# Patient Record
Sex: Male | Born: 1955 | Hispanic: No | Smoking: Never smoker
Health system: Southern US, Community
[De-identification: ages and names within clinical notes are randomized; demographics above are authoritative.]

## PROBLEM LIST (undated history)

## (undated) DIAGNOSIS — E119 Type 2 diabetes mellitus without complications: Secondary | ICD-10-CM

## (undated) DIAGNOSIS — M549 Dorsalgia, unspecified: Secondary | ICD-10-CM

## (undated) DIAGNOSIS — J45909 Unspecified asthma, uncomplicated: Secondary | ICD-10-CM

## (undated) DIAGNOSIS — I10 Essential (primary) hypertension: Secondary | ICD-10-CM

## (undated) HISTORY — PX: KNEE ARTHROSCOPY: SHX127

---

## 2016-05-05 ENCOUNTER — Ambulatory Visit: Payer: Self-pay | Admitting: Physician Assistant

## 2016-05-23 ENCOUNTER — Emergency Department: Payer: No Typology Code available for payment source

## 2016-05-23 ENCOUNTER — Emergency Department
Admission: EM | Admit: 2016-05-23 | Discharge: 2016-05-23 | Disposition: A | Discharge status: Home / Self Care | Payer: No Typology Code available for payment source | Attending: Emergency Medicine | Admitting: Emergency Medicine

## 2016-05-23 ENCOUNTER — Encounter: Payer: Self-pay | Admitting: Emergency Medicine

## 2016-05-23 DIAGNOSIS — S0093XA Contusion of unspecified part of head, initial encounter: Secondary | ICD-10-CM

## 2016-05-23 DIAGNOSIS — Z87891 Personal history of nicotine dependence: Secondary | ICD-10-CM | POA: Diagnosis not present

## 2016-05-23 DIAGNOSIS — Y92410 Unspecified street and highway as the place of occurrence of the external cause: Secondary | ICD-10-CM | POA: Insufficient documentation

## 2016-05-23 DIAGNOSIS — Z7984 Long term (current) use of oral hypoglycemic drugs: Secondary | ICD-10-CM | POA: Insufficient documentation

## 2016-05-23 DIAGNOSIS — Y999 Unspecified external cause status: Secondary | ICD-10-CM | POA: Diagnosis not present

## 2016-05-23 DIAGNOSIS — S0990XA Unspecified injury of head, initial encounter: Secondary | ICD-10-CM | POA: Diagnosis present

## 2016-05-23 DIAGNOSIS — M7918 Myalgia, other site: Secondary | ICD-10-CM

## 2016-05-23 DIAGNOSIS — Z79899 Other long term (current) drug therapy: Secondary | ICD-10-CM | POA: Insufficient documentation

## 2016-05-23 DIAGNOSIS — I10 Essential (primary) hypertension: Secondary | ICD-10-CM | POA: Diagnosis not present

## 2016-05-23 DIAGNOSIS — Y9389 Activity, other specified: Secondary | ICD-10-CM | POA: Diagnosis not present

## 2016-05-23 DIAGNOSIS — S0083XA Contusion of other part of head, initial encounter: Secondary | ICD-10-CM | POA: Insufficient documentation

## 2016-05-23 DIAGNOSIS — E119 Type 2 diabetes mellitus without complications: Secondary | ICD-10-CM | POA: Diagnosis not present

## 2016-05-23 DIAGNOSIS — M545 Low back pain: Secondary | ICD-10-CM | POA: Diagnosis not present

## 2016-05-23 HISTORY — DX: Essential (primary) hypertension: I10

## 2016-05-23 HISTORY — DX: Type 2 diabetes mellitus without complications: E11.9

## 2016-05-23 HISTORY — DX: Dorsalgia, unspecified: M54.9

## 2016-05-23 MED ORDER — IBUPROFEN 800 MG PO TABS: 800.0000 mg | ORAL_TABLET | Freq: Three times a day (TID) | ORAL | 0 refills | Status: DC | PRN
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
## 2016-05-23 NOTE — ED Notes (Signed)
PA aware of pt's BP, states okay for D/C.

## 2016-05-23 NOTE — ED Notes (Signed)
NAD noted at time of D/C. Pt denies questions or concerns. Pt ambulatory to the lobby at this time.  
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
## 2016-05-23 NOTE — ED Notes (Signed)
Pt given sprite 

## 2016-05-25 NOTE — Pre-Procedure Instructions (Addendum)
Jacob Bond  05/25/2016      [facility] - [place], [place] - [address] [place] [address] Phone: [phone] Fax: [phone]    Your procedure is scheduled on Wed. Aug. 9  Report to [facility] Admitting at 6:30 A.M.  Call this number if you have problems the morning of surgery:  [phone]              Any questions prior to surgery call [phone] Monday-Friday 8am-4pm   Remember:  Do not eat food or drink liquids after midnight on Tues. Aug. 8   Take these medicines the morning of surgery with A SIP OF WATER : percocet              Stop herbal medicines,all vitamins and NSAIDS: advil, ibuprofen, motrin,BC Powders, Goody's on Aug. 2             No metformin,glipizide am of surgery   How to Manage Your Diabetes Before and After Surgery  Why is it important to control my blood sugar before and after surgery? . Improving blood sugar levels before and after surgery helps healing and can limit problems. . A way of improving blood sugar control is eating a healthy diet by: o  Eating less sugar and carbohydrates o  Increasing activity/exercise o  Talking with your doctor about reaching your blood sugar goals . High blood sugars (greater than 180 mg/dL) can raise your risk of infections and slow your recovery, so you will need to focus on controlling your diabetes during the weeks before surgery. . Make sure that the doctor who takes care of your diabetes knows about your planned surgery including the date and location.  How do I manage my blood sugar before surgery? . Check your blood sugar at least 4 times a day, starting 2 days before surgery, to make sure that the level is not too high or low. o Check your blood sugar the morning of your surgery when you wake up and every 2 hours until you get to the Short Stay unit. . If your blood sugar is less than 70 mg/dL, you will need to treat for low blood sugar: o Do not take  insulin. o Treat a low blood sugar (less than 70 mg/dL) with  cup of clear juice (cranberry or apple), 4 glucose tablets, OR glucose gel. o Recheck blood sugar in 15 minutes after treatment (to make sure it is greater than 70 mg/dL). If your blood sugar is not greater than 70 mg/dL on recheck, call [phone] for further instructions. . Report your blood sugar to the short stay nurse when you get to Short Stay.  . If you are admitted to the hospital after surgery: o Your blood sugar will be checked by the staff and you will probably be given insulin after surgery (instead of oral diabetes medicines) to make sure you have good blood sugar levels. o The goal for blood sugar control after surgery is 80-180 mg/dL.       WHAT DO I DO ABOUT MY DIABETES MEDICATION?   Marland Kitchen Do not take oral diabetes medicines (pills) the morning of surgery.(metformin,glipizide)     Do not wear jewelry, make-up or nail polish.  Do not wear lotions, powders, or perfumes.  You may not wear deoderant.  Do not shave 48 hours prior to surgery.  Men may shave face and neck.  Do not bring valuables to the hospital.  [facility]  is not responsible for any belongings or valuables.  Contacts, dentures or bridgework may not be worn into surgery.  Leave your suitcase in the car.  After surgery it may be brought to your room.  For patients admitted to the hospital, discharge time will be determined by your treatment team.  Patients discharged the day of surgery will not be allowed to drive home.    Special instructions:   Special Instructions: [place] - Preparing for Surgery  Before surgery, you can play an important role.  Because skin is not sterile, your skin needs to be as free of germs as possible.  You can reduce the number of germs on you skin by washing with CHG (chlorahexidine gluconate) soap before surgery.  CHG is an antiseptic cleaner which kills germs and bonds with the skin to continue killing germs  even after washing.  Please DO NOT use if you have an allergy to CHG or antibacterial soaps.  If your skin becomes reddened/irritated stop using the CHG and inform your nurse when you arrive at Short Stay.  Do not shave (including legs and underarms) for at least 48 hours prior to the first CHG shower.  You may shave your face.  Please follow these instructions carefully:   1.  Shower with CHG Soap the night before surgery and the morning of Surgery.  2.  If you choose to wash your hair, wash your hair first as usual with your normal shampoo.  3.  After you shampoo, rinse your hair and body thoroughly to remove the Shampoo.  4.  Use CHG as you would any other liquid soap.  You can apply chg directly  to the skin and wash gently with scrungie or a clean washcloth.  5.  Apply the CHG Soap to your body ONLY FROM THE NECK DOWN.  Do not use on open wounds or open sores.  Avoid contact with your eyes ears, mouth and genitals (private parts).  Wash genitals (private parts)       with your normal soap.  6.  Wash thoroughly, paying special attention to the area where your surgery will be performed.  7.  Thoroughly rinse your body with warm water from the neck down.  8.  DO NOT shower/wash with your normal soap after using and rinsing off the CHG Soap.  9.  Pat yourself dry with a clean towel.            10.  Wear clean pajamas.            11.  Place clean sheets on your bed the night of your first shower and do not sleep with pets.  Day of Surgery  Do not apply any lotions/deodorants the morning of surgery.  Please wear clean clothes to the hospital/surgery center.  Please read over the following fact sheets that you were given. Pain Booklet, Coughing and Deep Breathing, MRSA Information and Surgical Site Infection Prevention

## 2016-05-26 ENCOUNTER — Encounter (HOSPITAL_COMMUNITY)
Admission: RE | Admit: 2016-05-26 | Discharge: 2016-05-26 | Disposition: A | Discharge status: Home / Self Care | Payer: Worker's Compensation | Source: Ambulatory Visit | Attending: Orthopedic Surgery | Admitting: Orthopedic Surgery

## 2016-05-26 ENCOUNTER — Encounter (HOSPITAL_COMMUNITY): Payer: Self-pay

## 2016-05-26 DIAGNOSIS — M5116 Intervertebral disc disorders with radiculopathy, lumbar region: Secondary | ICD-10-CM | POA: Diagnosis not present

## 2016-05-26 DIAGNOSIS — M4316 Spondylolisthesis, lumbar region: Secondary | ICD-10-CM | POA: Diagnosis not present

## 2016-05-26 DIAGNOSIS — Z0183 Encounter for blood typing: Secondary | ICD-10-CM | POA: Diagnosis not present

## 2016-05-26 DIAGNOSIS — Z01818 Encounter for other preprocedural examination: Secondary | ICD-10-CM | POA: Insufficient documentation

## 2016-05-26 DIAGNOSIS — Z01812 Encounter for preprocedural laboratory examination: Secondary | ICD-10-CM | POA: Insufficient documentation

## 2016-05-26 HISTORY — DX: Unspecified asthma, uncomplicated: J45.909

## 2016-05-26 LAB — SURGICAL PCR SCREEN
MRSA, PCR: NEGATIVE
Staphylococcus aureus: POSITIVE — AB

## 2016-05-26 LAB — CBC
HCT: 40.9 % (ref 39.0–52.0)
HEMOGLOBIN: 13.6 g/dL (ref 13.0–17.0)
MCH: 32.4 pg (ref 26.0–34.0)
MCHC: 33.3 g/dL (ref 30.0–36.0)
MCV: 97.4 fL (ref 78.0–100.0)
PLATELETS: 291 10*3/uL (ref 150–400)
RBC: 4.2 MIL/uL — AB (ref 4.22–5.81)
RDW: 12.3 % (ref 11.5–15.5)
WBC: 10.2 10*3/uL (ref 4.0–10.5)

## 2016-05-26 LAB — BASIC METABOLIC PANEL
ANION GAP: 6 (ref 5–15)
BUN: 13 mg/dL (ref 6–20)
CHLORIDE: 105 mmol/L (ref 101–111)
CO2: 28 mmol/L (ref 22–32)
Calcium: 9.4 mg/dL (ref 8.9–10.3)
Creatinine, Ser: 1.13 mg/dL (ref 0.61–1.24)
Glucose, Bld: 191 mg/dL — ABNORMAL HIGH (ref 65–99)
POTASSIUM: 3.8 mmol/L (ref 3.5–5.1)
SODIUM: 139 mmol/L (ref 135–145)

## 2016-05-26 LAB — TYPE AND SCREEN
ABO/RH(D): A NEG
ANTIBODY SCREEN: NEGATIVE

## 2016-05-26 LAB — ABO/RH: ABO/RH(D): A NEG

## 2016-05-26 LAB — GLUCOSE, CAPILLARY: GLUCOSE-CAPILLARY: 190 mg/dL — AB (ref 65–99)

## 2016-05-27 LAB — HEMOGLOBIN A1C
HEMOGLOBIN A1C: 8.6 % — AB (ref 4.8–5.6)
Mean Plasma Glucose: 200 mg/dL

## 2016-06-02 MED ORDER — CEFAZOLIN SODIUM-DEXTROSE 2-4 GM/100ML-% IV SOLN
2.0000 g | INTRAVENOUS | Status: AC
  Administered 2016-06-03: 2 g via INTRAVENOUS
  Filled 2016-06-02: qty 100

## 2016-06-02 NOTE — Anesthesia Preprocedure Evaluation (Addendum)
Anesthesia Evaluation  Patient identified by MRN, date of birth, ID band Patient awake    Reviewed: Allergy & Precautions, H&P , NPO status , Patient's Chart, lab work & pertinent test results  Airway Mallampati: II  TM Distance: >3 FB Neck ROM: Full    Dental no notable dental hx. (+) Teeth Intact, Dental Advisory Given   Pulmonary neg pulmonary ROS,    Pulmonary exam normal breath sounds clear to auscultation       Cardiovascular hypertension, Pt. on medications  Rhythm:Regular Rate:Normal     Neuro/Psych negative neurological ROS  negative psych ROS   GI/Hepatic negative GI ROS, Neg liver ROS,   Endo/Other  diabetes, Type 2, Oral Hypoglycemic Agents  Renal/GU negative Renal ROS  negative genitourinary   Musculoskeletal   Abdominal   Peds  Hematology negative hematology ROS (+)   Anesthesia Other Findings   Reproductive/Obstetrics negative OB ROS                            Anesthesia Physical Anesthesia Plan  ASA: II  Anesthesia Plan: General   Post-op Pain Management:    Induction: Intravenous  Airway Management Planned: Oral ETT  Additional Equipment:   Intra-op Plan:   Post-operative Plan: Extubation in OR  Informed Consent: I have reviewed the patients History and Physical, chart, labs and discussed the procedure including the risks, benefits and alternatives for the proposed anesthesia with the patient or authorized representative who has indicated his/her understanding and acceptance.   Dental advisory given  Plan Discussed with: CRNA  Anesthesia Plan Comments:         Anesthesia Quick Evaluation  

## 2016-06-03 ENCOUNTER — Inpatient Hospital Stay (HOSPITAL_COMMUNITY): Payer: Worker's Compensation

## 2016-06-03 ENCOUNTER — Inpatient Hospital Stay (HOSPITAL_COMMUNITY)
Admission: RE | Admit: 2016-06-03 | Discharge: 2016-06-05 | DRG: 460 | Disposition: A | Discharge status: Rehabilitation Facility | Payer: Worker's Compensation | Source: Ambulatory Visit | Attending: Orthopedic Surgery | Admitting: Orthopedic Surgery

## 2016-06-03 ENCOUNTER — Encounter (HOSPITAL_COMMUNITY): Payer: Self-pay | Admitting: Certified Registered Nurse Anesthetist

## 2016-06-03 ENCOUNTER — Inpatient Hospital Stay (HOSPITAL_COMMUNITY): Payer: Worker's Compensation | Admitting: Anesthesiology

## 2016-06-03 ENCOUNTER — Encounter (HOSPITAL_COMMUNITY)
Admission: RE | Disposition: A | Discharge status: Rehabilitation Facility | Payer: Self-pay | Source: Ambulatory Visit | Attending: Orthopedic Surgery

## 2016-06-03 DIAGNOSIS — Z79899 Other long term (current) drug therapy: Secondary | ICD-10-CM

## 2016-06-03 DIAGNOSIS — Z794 Long term (current) use of insulin: Secondary | ICD-10-CM | POA: Diagnosis not present

## 2016-06-03 DIAGNOSIS — E109 Type 1 diabetes mellitus without complications: Secondary | ICD-10-CM | POA: Diagnosis present

## 2016-06-03 DIAGNOSIS — Z981 Arthrodesis status: Secondary | ICD-10-CM | POA: Diagnosis not present

## 2016-06-03 DIAGNOSIS — I10 Essential (primary) hypertension: Secondary | ICD-10-CM

## 2016-06-03 DIAGNOSIS — Z96651 Presence of right artificial knee joint: Secondary | ICD-10-CM | POA: Diagnosis present

## 2016-06-03 DIAGNOSIS — M5116 Intervertebral disc disorders with radiculopathy, lumbar region: Secondary | ICD-10-CM | POA: Diagnosis present

## 2016-06-03 DIAGNOSIS — M4316 Spondylolisthesis, lumbar region: Principal | ICD-10-CM | POA: Diagnosis present

## 2016-06-03 DIAGNOSIS — M4806 Spinal stenosis, lumbar region: Secondary | ICD-10-CM | POA: Diagnosis present

## 2016-06-03 DIAGNOSIS — M5489 Other dorsalgia: Secondary | ICD-10-CM | POA: Diagnosis present

## 2016-06-03 DIAGNOSIS — E7800 Pure hypercholesterolemia, unspecified: Secondary | ICD-10-CM | POA: Diagnosis present

## 2016-06-03 DIAGNOSIS — E119 Type 2 diabetes mellitus without complications: Secondary | ICD-10-CM

## 2016-06-03 DIAGNOSIS — Z419 Encounter for procedure for purposes other than remedying health state, unspecified: Secondary | ICD-10-CM

## 2016-06-03 DIAGNOSIS — M5441 Lumbago with sciatica, right side: Secondary | ICD-10-CM | POA: Diagnosis not present

## 2016-06-03 DIAGNOSIS — G8929 Other chronic pain: Secondary | ICD-10-CM | POA: Diagnosis present

## 2016-06-03 DIAGNOSIS — M549 Dorsalgia, unspecified: Secondary | ICD-10-CM

## 2016-06-03 HISTORY — PX: TRANSFORAMINAL LUMBAR INTERBODY FUSION (TLIF) WITH PEDICLE SCREW FIXATION 1 LEVEL: SHX6141

## 2016-06-03 LAB — GLUCOSE, CAPILLARY
GLUCOSE-CAPILLARY: 164 mg/dL — AB (ref 65–99)
GLUCOSE-CAPILLARY: 157 mg/dL — AB (ref 65–99)
GLUCOSE-CAPILLARY: 165 mg/dL — AB (ref 65–99)
GLUCOSE-CAPILLARY: 167 mg/dL — AB (ref 65–99)
Glucose-Capillary: 78 mg/dL (ref 65–99)

## 2016-06-03 SURGERY — TRANSFORAMINAL LUMBAR INTERBODY FUSION (TLIF) WITH PEDICLE SCREW FIXATION 1 LEVEL
Anesthesia: General

## 2016-06-03 MED ORDER — OXYCODONE-ACETAMINOPHEN 10-325 MG PO TABS: 1.0000 | ORAL_TABLET | ORAL | 0 refills | Status: DC | PRN

## 2016-06-03 MED ORDER — PROPOFOL 10 MG/ML IV BOLUS
INTRAVENOUS | Status: AC
  Filled 2016-06-03: qty 20

## 2016-06-03 MED ORDER — LOSARTAN POTASSIUM-HCTZ 100-25 MG PO TABS: 1.0000 | ORAL_TABLET | Freq: Every day | ORAL | Status: DC

## 2016-06-03 MED ORDER — LACTATED RINGERS IV SOLN
INTRAVENOUS | Status: DC | PRN
  Administered 2016-06-03 (×2): via INTRAVENOUS

## 2016-06-03 MED ORDER — LOSARTAN POTASSIUM 50 MG PO TABS
100.0000 mg | ORAL_TABLET | Freq: Every day | ORAL | Status: DC
  Administered 2016-06-03 – 2016-06-05 (×3): 100 mg via ORAL
  Filled 2016-06-03 (×3): qty 2

## 2016-06-03 MED ORDER — GLYCOPYRROLATE 0.2 MG/ML IJ SOLN
INTRAMUSCULAR | Status: DC | PRN
  Administered 2016-06-03: 0.2 mg via INTRAVENOUS

## 2016-06-03 MED ORDER — PRAVASTATIN SODIUM 40 MG PO TABS
20.0000 mg | ORAL_TABLET | Freq: Every day | ORAL | Status: DC
  Administered 2016-06-03 – 2016-06-05 (×3): 20 mg via ORAL
  Filled 2016-06-03 (×3): qty 1

## 2016-06-03 MED ORDER — HYDROMORPHONE HCL 1 MG/ML IJ SOLN
INTRAMUSCULAR | Status: AC
  Administered 2016-06-03: 0.5 mg via INTRAVENOUS
  Filled 2016-06-03: qty 1

## 2016-06-03 MED ORDER — INSULIN ASPART 100 UNIT/ML ~~LOC~~ SOLN
0.0000 [IU] | Freq: Three times a day (TID) | SUBCUTANEOUS | Status: DC
  Administered 2016-06-04: 3 [IU] via SUBCUTANEOUS
  Administered 2016-06-04 (×2): 2 [IU] via SUBCUTANEOUS
  Administered 2016-06-05: 3 [IU] via SUBCUTANEOUS
  Administered 2016-06-05: 2 [IU] via SUBCUTANEOUS

## 2016-06-03 MED ORDER — PHENYLEPHRINE HCL 10 MG/ML IJ SOLN
INTRAVENOUS | Status: DC | PRN
  Administered 2016-06-03: 50 ug/min via INTRAVENOUS
  Administered 2016-06-03: 12:00:00 via INTRAVENOUS

## 2016-06-03 MED ORDER — DEXAMETHASONE SODIUM PHOSPHATE 10 MG/ML IJ SOLN
INTRAMUSCULAR | Status: AC
  Filled 2016-06-03: qty 1

## 2016-06-03 MED ORDER — CEFAZOLIN IN D5W 1 GM/50ML IV SOLN
1.0000 g | Freq: Three times a day (TID) | INTRAVENOUS | Status: AC
  Administered 2016-06-03 (×2): 1 g via INTRAVENOUS
  Filled 2016-06-03 (×2): qty 50

## 2016-06-03 MED ORDER — BUPIVACAINE-EPINEPHRINE (PF) 0.25% -1:200000 IJ SOLN
INTRAMUSCULAR | Status: AC
  Filled 2016-06-03: qty 30

## 2016-06-03 MED ORDER — LACTATED RINGERS IV SOLN: INTRAVENOUS | Status: DC

## 2016-06-03 MED ORDER — PHENYLEPHRINE 40 MCG/ML (10ML) SYRINGE FOR IV PUSH (FOR BLOOD PRESSURE SUPPORT)
PREFILLED_SYRINGE | INTRAVENOUS | Status: AC
  Filled 2016-06-03: qty 10

## 2016-06-03 MED ORDER — MORPHINE SULFATE (PF) 2 MG/ML IV SOLN
1.0000 mg | INTRAVENOUS | Status: DC | PRN
  Administered 2016-06-03: 4 mg via INTRAVENOUS
  Filled 2016-06-03: qty 2

## 2016-06-03 MED ORDER — PHENOL 1.4 % MT LIQD: 1.0000 | OROMUCOSAL | Status: DC | PRN

## 2016-06-03 MED ORDER — GLIPIZIDE ER 2.5 MG PO TB24
2.5000 mg | ORAL_TABLET | Freq: Every day | ORAL | Status: DC
  Administered 2016-06-04 – 2016-06-05 (×2): 2.5 mg via ORAL
  Filled 2016-06-03 (×2): qty 1

## 2016-06-03 MED ORDER — METOPROLOL TARTARATE 1 MG/ML SYRINGE (5ML)
Status: DC | PRN
  Administered 2016-06-03: 2.5 mg via INTRAVENOUS

## 2016-06-03 MED ORDER — SODIUM CHLORIDE 0.9 % IV SOLN: 250.0000 mL | INTRAVENOUS | Status: DC

## 2016-06-03 MED ORDER — BUPIVACAINE-EPINEPHRINE 0.25% -1:200000 IJ SOLN
INTRAMUSCULAR | Status: DC | PRN
  Administered 2016-06-03: 20 mL

## 2016-06-03 MED ORDER — METFORMIN HCL 500 MG PO TABS
1000.0000 mg | ORAL_TABLET | Freq: Two times a day (BID) | ORAL | Status: DC
  Administered 2016-06-03 – 2016-06-05 (×4): 1000 mg via ORAL
  Filled 2016-06-03 (×4): qty 2

## 2016-06-03 MED ORDER — MIDAZOLAM HCL 2 MG/2ML IJ SOLN
INTRAMUSCULAR | Status: AC
  Filled 2016-06-03: qty 2

## 2016-06-03 MED ORDER — ARTIFICIAL TEARS OP OINT
TOPICAL_OINTMENT | OPHTHALMIC | Status: AC
  Filled 2016-06-03: qty 3.5

## 2016-06-03 MED ORDER — PROPOFOL 10 MG/ML IV BOLUS
INTRAVENOUS | Status: DC | PRN
  Administered 2016-06-03: 200 mg via INTRAVENOUS
  Administered 2016-06-03: 50 mg via INTRAVENOUS

## 2016-06-03 MED ORDER — HEMOSTATIC AGENTS (NO CHARGE) OPTIME
TOPICAL | Status: DC | PRN
  Administered 2016-06-03: 1 via TOPICAL

## 2016-06-03 MED ORDER — PROPOFOL 1000 MG/100ML IV EMUL
INTRAVENOUS | Status: AC
  Filled 2016-06-03: qty 200

## 2016-06-03 MED ORDER — DEXAMETHASONE SODIUM PHOSPHATE 10 MG/ML IJ SOLN
INTRAMUSCULAR | Status: DC | PRN
  Administered 2016-06-03: 5 mg via INTRAVENOUS

## 2016-06-03 MED ORDER — OXYCODONE HCL 5 MG PO TABS
ORAL_TABLET | ORAL | Status: AC
  Filled 2016-06-03: qty 2

## 2016-06-03 MED ORDER — SUCCINYLCHOLINE CHLORIDE 200 MG/10ML IV SOSY
PREFILLED_SYRINGE | INTRAVENOUS | Status: AC
  Filled 2016-06-03: qty 10

## 2016-06-03 MED ORDER — PROPOFOL 500 MG/50ML IV EMUL
INTRAVENOUS | Status: DC | PRN
Start: 2016-06-03 — End: 2016-06-03
  Administered 2016-06-03: 11:00:00 via INTRAVENOUS
  Administered 2016-06-03: 25 ug/kg/min via INTRAVENOUS

## 2016-06-03 MED ORDER — METHOCARBAMOL 500 MG PO TABS
500.0000 mg | ORAL_TABLET | Freq: Four times a day (QID) | ORAL | Status: DC | PRN
Start: 2016-06-03 — End: 2016-06-05
  Administered 2016-06-03 – 2016-06-05 (×7): 500 mg via ORAL
  Filled 2016-06-03 (×8): qty 1

## 2016-06-03 MED ORDER — DEXTROSE 5 % IV SOLN
500.0000 mg | Freq: Four times a day (QID) | INTRAVENOUS | Status: DC | PRN
  Filled 2016-06-03: qty 5

## 2016-06-03 MED ORDER — GLYCOPYRROLATE 0.2 MG/ML IV SOSY
PREFILLED_SYRINGE | INTRAVENOUS | Status: AC
  Filled 2016-06-03: qty 3

## 2016-06-03 MED ORDER — ONDANSETRON HCL 4 MG/2ML IJ SOLN: 4.0000 mg | INTRAMUSCULAR | Status: DC | PRN

## 2016-06-03 MED ORDER — ROCURONIUM BROMIDE 10 MG/ML (PF) SYRINGE
PREFILLED_SYRINGE | INTRAVENOUS | Status: AC
  Filled 2016-06-03: qty 10

## 2016-06-03 MED ORDER — METHOCARBAMOL 500 MG PO TABS: 500.0000 mg | ORAL_TABLET | Freq: Three times a day (TID) | ORAL | 0 refills | Status: DC | PRN

## 2016-06-03 MED ORDER — ONDANSETRON HCL 4 MG/2ML IJ SOLN
INTRAMUSCULAR | Status: AC
  Filled 2016-06-03: qty 2

## 2016-06-03 MED ORDER — HYDROMORPHONE HCL 1 MG/ML IJ SOLN
0.2500 mg | INTRAMUSCULAR | Status: DC | PRN
  Administered 2016-06-03 (×4): 0.5 mg via INTRAVENOUS

## 2016-06-03 MED ORDER — ARTIFICIAL TEARS OP OINT
TOPICAL_OINTMENT | OPHTHALMIC | Status: DC | PRN
  Administered 2016-06-03: 1 via OPHTHALMIC

## 2016-06-03 MED ORDER — METOPROLOL TARTRATE 5 MG/5ML IV SOLN
INTRAVENOUS | Status: AC
  Filled 2016-06-03: qty 5

## 2016-06-03 MED ORDER — SODIUM CHLORIDE 0.9% FLUSH
3.0000 mL | Freq: Two times a day (BID) | INTRAVENOUS | Status: DC
  Administered 2016-06-03 (×2): 3 mL via INTRAVENOUS

## 2016-06-03 MED ORDER — CEFAZOLIN SODIUM 1 G IJ SOLR
INTRAMUSCULAR | Status: AC
  Filled 2016-06-03: qty 20

## 2016-06-03 MED ORDER — ACETAMINOPHEN 10 MG/ML IV SOLN
INTRAVENOUS | Status: AC
  Filled 2016-06-03: qty 100

## 2016-06-03 MED ORDER — LIDOCAINE HCL (CARDIAC) 20 MG/ML IV SOLN
INTRAVENOUS | Status: DC | PRN
  Administered 2016-06-03: 100 mg via INTRATRACHEAL

## 2016-06-03 MED ORDER — FENTANYL CITRATE (PF) 250 MCG/5ML IJ SOLN
INTRAMUSCULAR | Status: DC | PRN
  Administered 2016-06-03: 150 ug via INTRAVENOUS
  Administered 2016-06-03: 100 ug via INTRAVENOUS

## 2016-06-03 MED ORDER — THROMBIN 20000 UNITS EX SOLR
CUTANEOUS | Status: AC
  Filled 2016-06-03: qty 20000

## 2016-06-03 MED ORDER — 0.9 % SODIUM CHLORIDE (POUR BTL) OPTIME
TOPICAL | Status: DC | PRN
  Administered 2016-06-03 (×2): 1000 mL

## 2016-06-03 MED ORDER — INSULIN ASPART 100 UNIT/ML ~~LOC~~ SOLN
0.0000 [IU] | SUBCUTANEOUS | Status: DC
  Administered 2016-06-03: 3 [IU] via SUBCUTANEOUS

## 2016-06-03 MED ORDER — ACETAMINOPHEN 10 MG/ML IV SOLN
INTRAVENOUS | Status: DC | PRN
  Administered 2016-06-03: 1000 mg via INTRAVENOUS

## 2016-06-03 MED ORDER — SUCCINYLCHOLINE CHLORIDE 20 MG/ML IJ SOLN
INTRAMUSCULAR | Status: DC | PRN
  Administered 2016-06-03: 120 mg via INTRAVENOUS

## 2016-06-03 MED ORDER — FENTANYL CITRATE (PF) 250 MCG/5ML IJ SOLN
INTRAMUSCULAR | Status: AC
  Filled 2016-06-03: qty 5

## 2016-06-03 MED ORDER — OXYCODONE HCL 5 MG PO TABS
10.0000 mg | ORAL_TABLET | ORAL | Status: DC | PRN
  Administered 2016-06-03 – 2016-06-05 (×11): 10 mg via ORAL
  Filled 2016-06-03 (×10): qty 2

## 2016-06-03 MED ORDER — ONDANSETRON HCL 4 MG PO TABS: 4.0000 mg | ORAL_TABLET | Freq: Three times a day (TID) | ORAL | 0 refills | Status: DC | PRN

## 2016-06-03 MED ORDER — PHENYLEPHRINE HCL 10 MG/ML IJ SOLN
INTRAMUSCULAR | Status: DC | PRN
  Administered 2016-06-03 (×2): 120 ug via INTRAVENOUS
  Administered 2016-06-03: 160 ug via INTRAVENOUS

## 2016-06-03 MED ORDER — HYDROCHLOROTHIAZIDE 25 MG PO TABS
25.0000 mg | ORAL_TABLET | Freq: Every day | ORAL | Status: DC
  Administered 2016-06-03 – 2016-06-05 (×3): 25 mg via ORAL
  Filled 2016-06-03 (×3): qty 1

## 2016-06-03 MED ORDER — ONDANSETRON HCL 4 MG/2ML IJ SOLN
INTRAMUSCULAR | Status: DC | PRN
  Administered 2016-06-03: 4 mg via INTRAVENOUS

## 2016-06-03 MED ORDER — HYDROMORPHONE HCL 1 MG/ML IJ SOLN
0.5000 mg | INTRAMUSCULAR | Status: AC | PRN
  Administered 2016-06-03 (×2): 0.5 mg via INTRAVENOUS

## 2016-06-03 MED ORDER — MENTHOL 3 MG MT LOZG: 1.0000 | LOZENGE | OROMUCOSAL | Status: DC | PRN

## 2016-06-03 MED ORDER — LIDOCAINE 2% (20 MG/ML) 5 ML SYRINGE
INTRAMUSCULAR | Status: AC
  Filled 2016-06-03: qty 5

## 2016-06-03 MED ORDER — SODIUM CHLORIDE 0.9% FLUSH: 3.0000 mL | INTRAVENOUS | Status: DC | PRN

## 2016-06-03 MED ORDER — LUBIPROSTONE 24 MCG PO CAPS
24.0000 ug | ORAL_CAPSULE | Freq: Two times a day (BID) | ORAL | Status: DC
  Administered 2016-06-03 – 2016-06-05 (×4): 24 ug via ORAL
  Filled 2016-06-03 (×5): qty 1

## 2016-06-03 MED ORDER — DEXTROSE 5 % IV SOLN
500.0000 mg | Freq: Once | INTRAVENOUS | Status: AC
Start: 2016-06-03 — End: 2016-06-03
  Administered 2016-06-03: 500 mg via INTRAVENOUS
  Filled 2016-06-03: qty 5

## 2016-06-03 MED ORDER — THROMBIN 20000 UNITS EX SOLR
CUTANEOUS | Status: DC | PRN
  Administered 2016-06-03: 20000 [IU] via TOPICAL

## 2016-06-03 MED ORDER — MIDAZOLAM HCL 2 MG/2ML IJ SOLN
INTRAMUSCULAR | Status: DC | PRN
  Administered 2016-06-03 (×2): 1 mg via INTRAVENOUS

## 2016-06-03 SURGICAL SUPPLY — 72 items
BENZOIN TINCTURE PRP APPL 2/3 (GAUZE/BANDAGES/DRESSINGS) ×3 IMPLANT
BLADE SURG ROTATE 9660 (MISCELLANEOUS) IMPLANT
BUR EGG ELITE 4.0 (BURR) IMPLANT
BUR EGG ELITE 4.0MM (BURR)
CAGE TLIF NANOLOCK 11 (Cage) ×2 IMPLANT
CAGE TLIF NANOLOCK 11MM (Cage) ×1 IMPLANT
CANISTER SUCT 3000ML (MISCELLANEOUS) ×3 IMPLANT
CLIP NEUROVISION LG (CLIP) ×3 IMPLANT
CLOSURE STERI-STRIP 1/2X4 (GAUZE/BANDAGES/DRESSINGS) ×1
CLSR STERI-STRIP ANTIMIC 1/2X4 (GAUZE/BANDAGES/DRESSINGS) ×2 IMPLANT
COVER SURGICAL LIGHT HANDLE (MISCELLANEOUS) ×3 IMPLANT
DRAPE C-ARM 42X72 X-RAY (DRAPES) ×3 IMPLANT
DRAPE C-ARMOR (DRAPES) ×3 IMPLANT
DRAPE POUCH INSTRU U-SHP 10X18 (DRAPES) ×3 IMPLANT
DRAPE SURG 17X23 STRL (DRAPES) ×3 IMPLANT
DRAPE U-SHAPE 47X51 STRL (DRAPES) ×3 IMPLANT
DRSG AQUACEL AG ADV 3.5X10 (GAUZE/BANDAGES/DRESSINGS) ×3 IMPLANT
DURAPREP 26ML APPLICATOR (WOUND CARE) ×3 IMPLANT
ELECT BLADE 4.0 EZ CLEAN MEGAD (MISCELLANEOUS) ×3
ELECT BLADE 6.5 EXT (BLADE) IMPLANT
ELECT PENCIL ROCKER SW 15FT (MISCELLANEOUS) ×3 IMPLANT
ELECT REM PT RETURN 9FT ADLT (ELECTROSURGICAL) ×3
ELECTRODE BLDE 4.0 EZ CLN MEGD (MISCELLANEOUS) ×1 IMPLANT
ELECTRODE REM PT RTRN 9FT ADLT (ELECTROSURGICAL) ×1 IMPLANT
GLOVE BIOGEL PI IND STRL 8.5 (GLOVE) ×1 IMPLANT
GLOVE BIOGEL PI INDICATOR 8.5 (GLOVE) ×2
GLOVE SS BIOGEL STRL SZ 8.5 (GLOVE) ×1 IMPLANT
GLOVE SUPERSENSE BIOGEL SZ 8.5 (GLOVE) ×2
GOWN STRL REUS W/ TWL LRG LVL3 (GOWN DISPOSABLE) ×1 IMPLANT
GOWN STRL REUS W/TWL 2XL LVL3 (GOWN DISPOSABLE) ×6 IMPLANT
GOWN STRL REUS W/TWL LRG LVL3 (GOWN DISPOSABLE) ×2
GUIDEWIRE NITINOL BEVEL TIP (WIRE) ×12 IMPLANT
KIT BASIN OR (CUSTOM PROCEDURE TRAY) ×3 IMPLANT
KIT POSITION SURG JACKSON T1 (MISCELLANEOUS) IMPLANT
KIT ROOM TURNOVER OR (KITS) ×3 IMPLANT
MODULE NVM5 NEXT GEN EMG (NEEDLE) ×3 IMPLANT
NEEDLE 22X1 1/2 (OR ONLY) (NEEDLE) ×3 IMPLANT
NEEDLE I-PASS III (NEEDLE) ×3 IMPLANT
NEEDLE SPNL 18GX3.5 QUINCKE PK (NEEDLE) ×6 IMPLANT
NS IRRIG 1000ML POUR BTL (IV SOLUTION) ×6 IMPLANT
PACK LAMINECTOMY ORTHO (CUSTOM PROCEDURE TRAY) ×3 IMPLANT
PACK UNIVERSAL I (CUSTOM PROCEDURE TRAY) ×3 IMPLANT
PAD ARMBOARD 7.5X6 YLW CONV (MISCELLANEOUS) ×6 IMPLANT
PATTIES SURGICAL .5 X.5 (GAUZE/BANDAGES/DRESSINGS) ×3 IMPLANT
PATTIES SURGICAL .5 X1 (DISPOSABLE) ×6 IMPLANT
POSITIONER HEAD PRONE TRACH (MISCELLANEOUS) ×3 IMPLANT
PROBE BALL TIP NVM5 SNG USE (BALLOONS) ×3 IMPLANT
PUTTY DBX 1CC (Putty) ×3 IMPLANT
PUTTY DBX 1CC DEPUY (Putty) ×1 IMPLANT
REDUCTION EXT RELINE MAS MOD (Neuro Prosthesis/Implant) ×6 IMPLANT
ROD RELINE MAS LORD 5.5X45MM (Rod) ×6 IMPLANT
SCREW LOCK RELINE 5.5 TULIP (Screw) ×12 IMPLANT
SCREW RELINE RED 6.5X45MM POLY (Screw) ×3 IMPLANT
SCREW RELINE RED 6.5X50MM POLY (Screw) ×3 IMPLANT
SCREW SHANK MAS MOD 6.5X50MM (Screw) ×3 IMPLANT
SCREW SHANK RELINE 6.5X45MM 2C (Screw) ×3 IMPLANT
SHEET CONFORM 45LX20WX5H (Bone Implant) ×3 IMPLANT
SPONGE LAP 4X18 X RAY DECT (DISPOSABLE) ×6 IMPLANT
SPONGE SURGIFOAM ABS GEL 100 (HEMOSTASIS) ×3 IMPLANT
SURGIFLO W/THROMBIN 8M KIT (HEMOSTASIS) ×3 IMPLANT
SUT BONE WAX W31G (SUTURE) ×3 IMPLANT
SUT MNCRL AB 3-0 PS2 18 (SUTURE) ×6 IMPLANT
SUT VIC AB 1 CT1 18XCR BRD 8 (SUTURE) ×1 IMPLANT
SUT VIC AB 1 CT1 8-18 (SUTURE) ×2
SUT VIC AB 2-0 CT1 18 (SUTURE) ×3 IMPLANT
SYR BULB IRRIGATION 50ML (SYRINGE) ×3 IMPLANT
SYR CONTROL 10ML LL (SYRINGE) ×3 IMPLANT
TOWEL OR 17X24 6PK STRL BLUE (TOWEL DISPOSABLE) ×3 IMPLANT
TOWEL OR 17X26 10 PK STRL BLUE (TOWEL DISPOSABLE) ×3 IMPLANT
TRAY FOLEY CATH 16FRSI W/METER (SET/KITS/TRAYS/PACK) ×3 IMPLANT
WATER STERILE IRR 1000ML POUR (IV SOLUTION) IMPLANT
YANKAUER SUCT BULB TIP NO VENT (SUCTIONS) ×3 IMPLANT

## 2016-06-03 NOTE — Anesthesia Postprocedure Evaluation (Signed)
Anesthesia Post Note  Patient: [place]  Procedure(s) Performed: Procedure(s) (LRB): TRANSFORAMINAL LUMBAR INTERBODY FUSION (TLIF) WITH PEDICLE SCREW FIXATION 1 LEVEL (N/A)  Patient location during evaluation: PACU Anesthesia Type: General Level of consciousness: awake and alert Pain management: pain level controlled Vital Signs Assessment: post-procedure vital signs reviewed and stable Respiratory status: spontaneous breathing, nonlabored ventilation and respiratory function stable Cardiovascular status: blood pressure returned to baseline and stable Postop Assessment: no signs of nausea or vomiting Anesthetic complications: no    Last Vitals:  Vitals:   06/03/16 1300 06/03/16 1315  BP: 130/88 133/86  Pulse: 93 80  Resp: (!) 21 (!) 24  Temp:      Last Pain:  Vitals:   06/03/16 1238  TempSrc:   PainSc: 0-No pain                 Mel Langan,W. EDMOND

## 2016-06-03 NOTE — Progress Notes (Signed)
Rehab Admissions Coordinator Note:  Patient was screened by Trish MageLogue, Kania Regnier M for appropriateness for an Inpatient Acute Rehab Consult.  Noted PT/OT recommending CIR.  At this time, we are recommending Inpatient Rehab consult.  Trish MageLogue, Jeffery Gammell M 06/03/2016, 6:39 PM  I can be reached at [phone].

## 2016-06-03 NOTE — Brief Op Note (Signed)
06/03/2016  12:34 PM  PATIENT:  Jacob Bond  60 y.o. male  PRE-OPERATIVE DIAGNOSIS:  spondylolisthesis H&P L4-5 right radicular pain  POST-OPERATIVE DIAGNOSIS:  spondylolisthesis H&P L4-5 right radicular pain  PROCEDURE:  Procedure(s): TRANSFORAMINAL LUMBAR INTERBODY FUSION (TLIF) WITH PEDICLE SCREW FIXATION 1 LEVEL (N/A)  SURGEON:  Surgeon(s) and Role:    * Venita Lickahari Quaneisha Hanisch, MD - Primary  PHYSICIAN ASSISTANT:   ASSISTANTS: Carmen Mayo   ANESTHESIA:   general  EBL:  Total I/O In: 3000 [I.V.:3000] Out: 535 [Urine:210; Blood:325]  BLOOD ADMINISTERED:none  DRAINS: none   LOCAL MEDICATIONS USED:  MARCAINE     SPECIMEN:  No Specimen  DISPOSITION OF SPECIMEN:  N/A  COUNTS:  YES  TOURNIQUET:  * No tourniquets in log *  DICTATION: .Other Dictation: Dictation Number [id number]  PLAN OF CARE: Admit to inpatient   PATIENT DISPOSITION:  PACU - hemodynamically stable.
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
## 2016-06-03 NOTE — Transfer of Care (Signed)
Immediate Anesthesia Transfer of Care Note  Patient: [place]  Procedure(s) Performed: Procedure(s): TRANSFORAMINAL LUMBAR INTERBODY FUSION (TLIF) WITH PEDICLE SCREW FIXATION 1 LEVEL (N/A)  Patient Location: PACU  Anesthesia Type:General  Level of Consciousness: awake, alert , oriented and patient cooperative  Airway & Oxygen Therapy: Patient Spontanous Breathing and Patient connected to face mask oxygen  Post-op Assessment: Report given to RN, Post -op Vital signs reviewed and stable, Patient moving all extremities X 4 and Patient able to stick tongue midline  Post vital signs: Reviewed and stable  Last Vitals:  Vitals:   06/03/16 0637  BP: (!) 172/101  Pulse: 82  Resp: 20  Temp: 36.8 C    Last Pain:  Vitals:   06/03/16 0637  TempSrc: Oral  PainSc:       Patients Stated Pain Goal: 2 (06/03/16 [id number])  Complications: No apparent anesthesia complications

## 2016-06-03 NOTE — Op Note (Signed)
NAMGita Kudo:  Waldeck, Bonifacio               ACCOUNT NO.:  [id number]  MEDICAL RECORD NO.:  [id number]  LOCATION:  [facility]                         FACILITY:  [facility]  PHYSICIAN:  Abygail Galeno D. Shon BatonBrooks, M.D. DATE OF BIRTH:  1956-06-22  DATE OF PROCEDURE:  06/03/2016 DATE OF DISCHARGE:                              OPERATIVE REPORT   PREOPERATIVE DIAGNOSIS:  Degenerative lumbar spondylolisthesis, L4-5 with right radicular leg pain.  POSTOPERATIVE DIAGNOSIS:  Degenerative lumbar spondylolisthesis, L4-5 with right radicular leg pain.  OPERATIVE PROCEDURE:  Transforaminal lumbar interbody fusion, L4-5 with fusion.  IMPLANT SYSTEM USED:  NuVasive pedicle screw system.  At L4, we placed a 15-mm length 6.5 diameter screw.  At L5, we placed a 45-mm length 6.5 diameter screw with a Titan titanium nanoLOCK intervertebral TLIF cage, size 11 extra long.  SURGEON:  Laurisa Sahakian D. Shon BatonBrooks, M.D.  FIRST ASSISTANT:  Anette Riedelarmen Mayo, [place].  HISTORY:  This is a very pleasant 60 year old gentleman who 8 months ago was involved in a work related injury which resulted in significant back, buttock, and neuropathic right leg pain.  Despite injection therapy, physiotherapy, medications, activity modifications and other conservative management, he failed to improve and the symptoms progressed.  As a result of the failure of conservative care, we elected to proceed with surgery.  All appropriate risks, benefits, and alternatives were discussed with the patient and consent was obtained.  OPERATIVE NOTE:  The patient was brought to the operating room and placed supine on the operating table.  After successful induction of general anesthesia and endotracheal intubation, TEDs, SCDs, and Foley were inserted.  The neuromonitoring pads were applied and the patient was turned prone onto the Wilson frame.  All bony prominences were well padded.  The back was prepped and draped in a standard fashion.  Time- out was taken to confirm patient,  procedure, and all other pertinent important data.  Once this was done and since he was having mostly right radicular leg pain, I elected to do the decompression portion on the right side.  I began with placing the percutaneous left-sided 4-5 screws.  I identified the lateral border of the pedicle of L4 and L5, marked it and infiltrated with 0.25% Marcaine.  A small incision was made and a Jamshidi needle was advanced percutaneously down to the lateral aspect of the facet complex.  I then advanced the Jamshidi needle into the pedicle.  Using real-time EMG neuro monitoring as well as fluoro, I confirmed trajectory.  Once I was nearing the medial wall of the pedicle on the AP view, I switched to the lateral to confirm that I was just beyond the posterior wall of the vertebral body.  With no abnormal neural monitoring and EMG readings, I advanced the Jamshidi needle through the pedicle and into the vertebral body of L4.  A guidepin was placed and I repeated the exact procedure at L5.  Once both pedicles were cannulated, I then tapped over the guidepins and then placed a 50-mm screw at L4 and a 45-mm length screw at L5.  Both screws were then directly stimulated and there was no response at 40 milliamps of stimulation.  At this point, I then went to the  contralateral side and again identified the lateral border of the L4 and L5 facet.  I marked this incision site out, infiltrated with 0.25% Marcaine and then made a Wiltse incision approximately 2-1/2 fingerbreadths from the midline. Sharp dissection was carried out down to the deep fascia.  The deep fascia was sharply incised, and I bluntly dissected through the paraspinal muscle so I could palpate the facet complex.  I then placed the Jamshidi needle and using the same technique I had used on the contralateral side I placed the Jamshidi needle and advanced it through the pedicle into the vertebral body of L4.  A guidepin was placed and  I repeated this procedure at L5.  Once both pedicles were cannulated, I then tapped over the guidepin and then placed the same sized screws that I had used on the left.  These however were attached to the retracting arms.  I then constructed my retractor.  I then again directly stimulated the pedicle screws and there was no response in 40 milliamps of stimulation.  Also the x-rays were satisfactory in terms of trajectory and positioning.  I then mobilized the medial portion of the paraspinal muscle.  I then placed my medial retracting blade.  I could now visualize the spinous process lamina and facet complex at L4-5.  Using bipolar electrocautery, I obtained hemostasis and then I used my Bovie to remove the facet capsule.  I then used curettes to expose the entire pars of L4 and the leading edge of the L4 lamina.  Once I had the bony landmarks identified, I used the osteotome to remove the entire inferior L4 facet complex.  Once this was done, I then used a 3 and 4 mm Kerrison punch to resect the remaining portion of the L4 pars so that I could identify the L4 nerve root in the foramen.  I then created a plane underneath the L4 lamina with a curved curette and then used my 3-mm Kerrison punch to perform a generous laminotomy of L3.  I then gently dissected through the very thickened ligamentum flavum with a Penfield 4 and then used my Kerrison punches to remove the ligamentum flavum.  There were significant epidural veins noted and these were all coagulated with bipolar electrocautery.  Once I was able to create a plane between the dura and the ligamentum flavum, I completely resected this so I could expose the lateral aspect of the thecal sac.  I then identified the L5 nerve root and traced it in towards the foramen.  At this point, I removed all the medial osteophytes from the superior L5 facet complex so I could visualize the medial and superior margins of the L5 pedicle.  I then  removed the remaining bone to completely expose the L4-5 space.  I now could identify the exiting L4 nerve root, traversing L5 nerve root, thecal sac and disk space as well as the L4 and L5 pedicles on this right side.  With the decompression complete, I then placed neural patties to protect as well as retract the nerve roots and then used a 15- blade scalpel to perform an annulotomy.  Then, using pituitary rongeurs, curved Kerrison rongeurs and curettes, I removed all of the disk material at the L4-5 disk level.  I made sure I could get all the way to the contralateral side with my scrapers so I had an adequate decompression.  I then placed my [facility], and alternating with Epstein curette underneath the posterior aspect of the annulus  and resected the disk herniation that was seen on x-ray that was more central.  At this point, I had an adequate diskectomy.  I could see the bleeding bone edges of the endplates.  I irrigated the wound copiously with normal saline and then placed the Conform allograft sheet along the anterior anulus along with bone graft.  I then obtained a size 11 extra long nanoLOCK cage, packed this with bone graft and DBX.  I then malleted the graft into position.  Once I pushed it across the midline, it came to rest in a horizontal plane in the anterior third of the disk space.  I was pleased with this in the final position.  I irrigated again copiously with normal saline and then dropped the kyphosis that was built into the frame.  I then applied the polyaxial heads to the screws and then measured and placed a 45-mm length rod and placed my locking caps.  The caps were then torqued off and tightened according to manufactures standards.  I then went back to the left-hand side, measured and placed the same size rod and torqued it off.  Final x-rays were taken which demonstrated satisfactory hardware position.  I then irrigated the wounds again and made sure I  had hemostasis using bipolar electrocautery.  I then closed the fascia with a #1 Stratafix suture and then superficial with 2-0 Vicryl sutures and 3- 0 Monocryl for the skin.  Steri-Strips and a dry dressing were applied, and the patient was transferred to the PACU without incident.  At the end of the case, all needle and sponge counts were correct.  There were no adverse intraoperative events.     Dontavius Keim D. Shon Baton, M.D.     DDB/MEDQ  D:  06/03/2016  T:  06/03/2016  Job:  [id number]  cc:   Dr. Shon Baton' office

## 2016-06-03 NOTE — Evaluation (Signed)
Physical Therapy Evaluation Patient Details Name: Jacob Bond MRN: [id number] DOB: [birth year] Today's Date: 06/03/2016   History of Present Illness  60 y.o. male s/p TLIF L4-5. PMH significant for HTN and DM type II.  Clinical Impression  Pt admitted with/for lumbar fusion surgury.  Pt currently limited functionally due to the problems listed. ( See problems list.)   Pt will benefit from PT to maximize function and safety in order to get ready for next venue listed below.     Follow Up Recommendations CIR    Equipment Recommendations  Rolling walker with 5" wheels    Recommendations for Other Services Rehab consult     Precautions / Restrictions Precautions Precautions: Back;Fall Precaution Booklet Issued: Yes (comment) Precaution Comments: Educated pt on all back precautions and began reviewing handout Required Braces or Orthoses: Spinal Brace Spinal Brace: Lumbar corset;Applied in sitting position Restrictions Weight Bearing Restrictions: No      Mobility  Bed Mobility Overal bed mobility: Needs Assistance Bed Mobility: Rolling;Sidelying to Sit Rolling: Min assist Sidelying to sit: Min assist       General bed mobility comments: Discussed and demo'd safe transition sit to/from sidelying  Transfers Overall transfer level: Needs assistance Equipment used: Rolling walker (2 wheeled) Transfers: Sit to/from Stand Sit to Stand: Min assist         General transfer comment: Stability assist  Ambulation/Gait Ambulation/Gait assistance: Min guard Ambulation Distance (Feet): 180 Feet Assistive device: Rolling walker (2 wheeled) Gait Pattern/deviations: Step-through pattern   Gait velocity interpretation: Below normal speed for age/gender General Gait Details: guarded, but steady.  Stairs            Wheelchair Mobility    Modified Rankin (Stroke Patients Only)       Balance Overall balance assessment: No apparent balance deficits (not formally  assessed) Sitting-balance support: No upper extremity supported;Feet supported Sitting balance-Leahy Scale: Good     Standing balance support: No upper extremity supported;During functional activity Standing balance-Leahy Scale: Fair                               Pertinent Vitals/Pain Pain Assessment: 0-10 Pain Score: 10-Worst pain ever Pain Location: back Pain Descriptors / Indicators: Guarding;Sore Pain Intervention(s): Monitored during session;Repositioned    Home Living Family/patient expects to be discharged to:: Unsure Living Arrangements: Alone               Additional Comments: Pt lives in a hotel in [place], [place] to be near work. Pt has no permanent residence and has no family/friends in the immediate area to assist him. Pt's daughter lives in [place] and pt has a work friend in [place], [place] who would be willing to let him stay at her house, but she has many stairs there and works a lot. Pt does not have a car.    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     Hand Dominance   Dominant Hand: Right    Extremity/Trunk Assessment   Upper Extremity Assessment: Defer to OT evaluation           Lower Extremity Assessment: Generalized weakness;Overall [facility] for tasks assessed      Cervical / Trunk Assessment: Other exceptions  Communication   Communication: No difficulties  Cognition Arousal/Alertness: Awake/alert Behavior During Therapy: WFL for tasks assessed/performed Overall Cognitive Status: Within  Functional Limits for tasks assessed                      General Comments General comments (skin integrity, edema, etc.): Instructed in back care/prec, logroll, bracing issues, lifting restrictions, progression of activity    Exercises        Assessment/Plan    PT Assessment Patient needs continued PT services  PT Diagnosis Difficulty  walking;Acute pain   PT Problem List Decreased strength;Decreased activity tolerance;Decreased mobility;Decreased knowledge of use of DME;Decreased knowledge of precautions;Pain  PT Treatment Interventions DME instruction;Gait training;Stair training;Functional mobility training;Therapeutic activities;Patient/family education   PT Goals (Current goals can be found in the Care Plan section) Acute Rehab PT Goals Patient Stated Goal: to go to rehab and get better PT Goal Formulation: With patient Time For Goal Achievement: 06/10/16 Potential to Achieve Goals: Good    Frequency Min 5X/week   Barriers to discharge Decreased caregiver support      Co-evaluation               End of Session   Activity Tolerance: Patient tolerated treatment well;Patient limited by pain Patient left: in chair;with call bell/phone within reach Nurse Communication: Mobility status         Time: 5409-81191708-1737 PT Time Calculation (min) (ACUTE ONLY): 29 min   Charges:   PT Evaluation $PT Eval Moderate Complexity: 1 Procedure PT Treatments $Gait Training: 8-22 mins   PT G Codes:        [name], Eliseo GumKenneth V 06/03/2016, 6:24 PM 06/03/2016  [place] BingKen Skyrah Krupp, PT [phone]  (pager)

## 2016-06-03 NOTE — Progress Notes (Signed)
Occupational Therapy Evaluation Patient Details Name: Jacob Bond MRN: [id number] DOB: 1956-07-25 Today's Date: 06/03/2016    History of Present Illness 60 y.o. male s/p TLIF L4-5. PMH significant for HTN and DM type II.   Clinical Impression   PTA, pt was independent with all ADLs and mobility. Pt currently requires mod assist for ADLs and min assist for basic transfers due to acute back pain and resulting balance deficits. Pt has difficult social situation as he is currently living in a hotel for work and has no family/friends in the immediate area to assist him at discharge. Highly recommend CIR for post-acute rehab as pt has great potential to reach mod I level of functioning and does have a friend in [place], [place] that will allow him to stay with her and can provide some assistance. Will continue to follow acutely.    Follow Up Recommendations  CIR    Equipment Recommendations  3 in 1 bedside comode;Tub/shower bench;Other (comment) (Adaptive equipment)    Recommendations for Other Services       Precautions / Restrictions Precautions Precautions: Back;Fall Precaution Booklet Issued: Yes (comment) Precaution Comments: Educated pt on all back precautions and began reviewing handout Required Braces or Orthoses: Spinal Brace Spinal Brace: Lumbar corset;Applied in sitting position Restrictions Weight Bearing Restrictions: No      Mobility Bed Mobility Overal bed mobility: Needs Assistance Bed Mobility: Rolling;Sidelying to Sit Rolling: Min assist Sidelying to sit: Min assist       General bed mobility comments: Min assist for trunk support to come to sitting position. VCs for log roll technique.  Transfers Overall transfer level: Needs assistance Equipment used: 1 person hand held assist Transfers: Sit to/from Stand Sit to Stand: Min assist         General transfer comment: Min assist for boost to stand and to stabilize balance upon standing. Handheld assist  required to ambulate to chair as no RW availabile in room. VCs for hand placement and to adhere to back precautions.    Balance Overall balance assessment: Needs assistance Sitting-balance support: No upper extremity supported;Feet supported Sitting balance-Leahy Scale: Fair     Standing balance support: No upper extremity supported;During functional activity Standing balance-Leahy Scale: Poor                              ADL Overall ADL's : Needs assistance/impaired     Grooming: Wash/dry hands;Minimal assistance;Standing   Upper Body Bathing: Minimal assitance;Sitting   Lower Body Bathing: Maximal assistance;Sit to/from stand   Upper Body Dressing : Minimal assistance;Sitting Upper Body Dressing Details (indicate cue type and reason): to don back brace Lower Body Dressing: Maximal assistance;Sit to/from stand   Toilet Transfer: Minimal assistance;Cueing for safety;Ambulation;BSC   Toileting- Clothing Manipulation and Hygiene: Minimal assistance;Sit to/from stand       Functional mobility during ADLs: Minimal assistance       Vision Vision Assessment?: No apparent visual deficits   Perception     Praxis      Pertinent Vitals/Pain Pain Assessment: 0-10 Pain Score: 10-Worst pain ever Pain Location: back Pain Descriptors / Indicators: Operative site guarding;Grimacing;Sore;Tender Pain Intervention(s): Limited activity within patient's tolerance;Monitored during session;Premedicated before session;Repositioned     Hand Dominance Right   Extremity/Trunk Assessment Upper Extremity Assessment Upper Extremity Assessment: Overall WFL for tasks assessed   Lower Extremity Assessment Lower Extremity Assessment: Defer to PT evaluation   Cervical / Trunk Assessment Cervical / Trunk Assessment: Other  exceptions Cervical / Trunk Exceptions: s/p lumbar surgery   Communication Communication Communication: No difficulties   Cognition Arousal/Alertness:  Awake/alert Behavior During Therapy: WFL for tasks assessed/performed Overall Cognitive Status: Within Functional Limits for tasks assessed                     General Comments       Exercises       Shoulder Instructions      Home Living Family/patient expects to be discharged to:: Unsure Living Arrangements: Alone                               Additional Comments: Pt lives in a hotel in [place], [place] to be near work. Pt has no permanent residence and has no family/friends in the immediate area to assist him. Pt's daughter lives in [place] and pt has a work friend in [place], [place] who would be willing to let him stay at her house, but she has many stairs there and works a lot. Pt does not have a car.      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 Does not have a car now    OT Diagnosis: Generalized weakness;Acute pain   OT Problem List: Decreased strength;Decreased range of motion;Decreased activity tolerance;Impaired balance (sitting and/or standing);Decreased safety awareness;Decreased knowledge of use of DME or AE;Decreased knowledge of precautions;Pain   OT Treatment/Interventions: Self-care/ADL training;Therapeutic exercise;DME and/or AE instruction;Therapeutic activities;Patient/family education;Balance training    OT Goals(Current goals can be found in the care plan section) Acute Rehab OT Goals Patient Stated Goal: to go to rehab and get better OT Goal Formulation: With patient Time For Goal Achievement: 06/17/16 Potential to Achieve Goals: Good ADL Goals Pt Will Perform Grooming: with modified independence;standing Pt Will Perform Upper Body Bathing: with modified independence;sitting Pt Will Perform Lower Body Bathing: with modified independence;with adaptive equipment;sit to/from stand Pt Will Transfer to Toilet: with modified independence;ambulating;bedside commode (over  toilet) Pt Will Perform Toileting - Clothing Manipulation and hygiene: with modified independence;sit to/from stand;sitting/lateral leans Additional ADL Goal #1: Pt will independently don/doff back brace.  Additional ADL Goal #2: Pt will demonstrate adherence to 3/3 back precautions with min verbal cues.  OT Frequency: Min 3X/week   Barriers to D/C: Decreased caregiver support  Lives alone in a hotel room with no family/friends availabile to assist or provide transportation       Co-evaluation              End of Session Equipment Utilized During Treatment: Gait belt;Rolling walker Nurse Communication: Mobility status  Activity Tolerance: Patient limited by pain Patient left: in chair;with call bell/phone within reach   Time: [id number] OT Time Calculation (min): 36 min Charges:  OT General Charges $OT Visit: 1 Procedure OT Evaluation $OT Eval Moderate Complexity: 1 Procedure OT Treatments $Self Care/Home Management : 8-22 mins G-Codes:    Nils PyleJulia Iolanda Folson, OTR/L Pager: [phone] 06/03/2016, 5:52 PM

## 2016-06-03 NOTE — Anesthesia Procedure Notes (Signed)
Procedure Name: Intubation Date/Time: 06/03/2016 8:41 AM Performed by: Gaynelle AduFITZGERALD, [place] Pre-anesthesia Checklist: Patient identified, Emergency Drugs available, Suction available and Patient being monitored Patient Re-evaluated:Patient Re-evaluated prior to inductionOxygen Delivery Method: Circle system utilized Preoxygenation: Pre-oxygenation with 100% oxygen Intubation Type: IV induction Ventilation: Mask ventilation without difficulty Laryngoscope Size: Mac and 3 Grade View: Grade III Tube type: Oral Tube size: 7.0 (7.5 then 7.0 due to resistance) mm Number of attempts: 4 (7.5 attempted x1, then 7.0. MDA attempted with resistance, bougie stylet passed with 7.0 passed over bougie.) Airway Equipment and Method: Stylet and Bougie stylet Placement Confirmation: ETT inserted through vocal cords under direct vision,  positive ETCO2 and breath sounds checked- equal and bilateral Secured at: 24 cm Tube secured with: Tape Dental Injury: Teeth and Oropharynx as per pre-operative assessment  Difficulty Due To: Difficulty was unanticipated Comments: Possible use glidescope, bougie at bedside

## 2016-06-04 ENCOUNTER — Encounter (HOSPITAL_COMMUNITY): Payer: Self-pay | Admitting: Orthopedic Surgery

## 2016-06-04 ENCOUNTER — Inpatient Hospital Stay (HOSPITAL_COMMUNITY): Payer: Worker's Compensation

## 2016-06-04 LAB — HEMOGLOBIN A1C
Hgb A1c MFr Bld: 8.1 % — ABNORMAL HIGH (ref 4.8–5.6)
Mean Plasma Glucose: 186 mg/dL

## 2016-06-04 LAB — GLUCOSE, CAPILLARY
GLUCOSE-CAPILLARY: 140 mg/dL — AB (ref 65–99)
Glucose-Capillary: 150 mg/dL — ABNORMAL HIGH (ref 65–99)
Glucose-Capillary: 172 mg/dL — ABNORMAL HIGH (ref 65–99)
Glucose-Capillary: 142 mg/dL — ABNORMAL HIGH (ref 65–99)

## 2016-06-04 MED ORDER — MAGNESIUM CITRATE PO SOLN
1.0000 | Freq: Once | ORAL | Status: AC
  Administered 2016-06-04: 1 via ORAL
  Filled 2016-06-04: qty 296

## 2016-06-04 NOTE — Progress Notes (Signed)
Physical Therapy Treatment Patient Details Name: Jacob Bond MRN: [id number] DOB: 11-Dec-1955 Today's Date: 06/04/2016    History of Present Illness 60 y.o. male s/p TLIF L4-5. PMH significant for HTN and DM type II.    PT Comments    Pt very pleasant and eager to mobilize. However, pt continues to require assist for mobility as well as cueing throughout to maintain precautions. Pt continues to need assist for dressing, RW use and assist with mobility with no one available to assist at DC. Continue to recommend CIR to reach mod I prior to D/C. Will follow. Recommend daily ambulation with nursing staff.   Follow Up Recommendations  CIR     Equipment Recommendations  Rolling walker with 5" wheels    Recommendations for Other Services       Precautions / Restrictions Precautions Precautions: Back;Fall Precaution Comments: Educated pt on all back precautions with handout reviewed, pt able to recall 2/3  Required Braces or Orthoses: Spinal Brace Spinal Brace: Lumbar corset;Applied in sitting position    Mobility  Bed Mobility Overal bed mobility: Needs Assistance Bed Mobility: Rolling;Sidelying to Sit Rolling: Min guard Sidelying to sit: Min assist       General bed mobility comments: cues for sequence and assist to elevate trunk  Transfers Overall transfer level: Needs assistance Equipment used: None Transfers: Sit to/from Stand Sit to Stand: Min assist         General transfer comment: cues for hand placement and sequence with assist at trunk to rise  Ambulation/Gait Ambulation/Gait assistance: Min guard Ambulation Distance (Feet): 160 Feet Assistive device: Rolling walker (2 wheeled) Gait Pattern/deviations: Step-through pattern;Decreased stride length;Shuffle   Gait velocity interpretation: Below normal speed for age/gender General Gait Details: cues for posture in RW, very slow guarded and painful gait   Stairs            Wheelchair Mobility     Modified Rankin (Stroke Patients Only)       Balance Overall balance assessment: Needs assistance   Sitting balance-Leahy Scale: Good       Standing balance-Leahy Scale: Fair                      Cognition Arousal/Alertness: Awake/alert Behavior During Therapy: WFL for tasks assessed/performed Overall Cognitive Status: Within Functional Limits for tasks assessed                      Exercises      General Comments        Pertinent Vitals/Pain Pain Score: 10-Worst pain ever Pain Location: back and bil thigh Pain Descriptors / Indicators: Guarding;Sore Pain Intervention(s): Limited activity within patient's tolerance;Premedicated before session;Monitored during session;Repositioned    Home Living                      Prior Function            PT Goals (current goals can now be found in the care plan section) Progress towards PT goals: Progressing toward goals    Frequency  Min 5X/week    PT Plan Current plan remains appropriate    Co-evaluation             End of Session Equipment Utilized During Treatment: Back brace Activity Tolerance: Patient tolerated treatment well Patient left: in chair;with call bell/phone within reach     Time: 0857-0925 PT Time Calculation (min) (ACUTE ONLY): 28 min  Charges:  $Gait Training: 8-22 mins $  Therapeutic Activity: 8-22 mins                    G Codes:      Delorse Lek 06-17-16, 10:00 AM Delaney Meigs, PT [phone]

## 2016-06-04 NOTE — Care Management Note (Addendum)
Case Management Note  Patient Details  Name: Jacob Bond MRN: [id number] Date of Birth: 01/04/1956  Subjective/Objective: s/p TLIF L4-5                  Action/Plan: Discharge Planning:  NCM spoke to pt at bedside. 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 States his family is mainly in [place]. Pt states he will need RW.States he does have insurance with [facility] and states worker' comp is handling his surgery. Worker's Comp SW, Deidra NewmanMinga [phone], fax [phone]. Claim # [id number]. Adjuster Carmell Austriaathy Burgess, # [phone], fax [phone]. Pt has approved for IP rehab. States they will assist him post with housing.      Expected Discharge Date:                 Expected Discharge Plan:  IP Rehab Facility  In-House Referral:  Clinical Social Work  Discharge planning Services  CM Consult  Post Acute Care Choice:  NA Choice offered to:  NA  DME Arranged:  N/A DME Agency:  NA  HH Arranged:  NA HH Agency:  NA  Status of Service:  In process, will continue to follow  If discussed at Long Length of Stay Meetings, dates discussed:    Additional Comments:  [name], [name], RN 06/04/2016, 3:10 PM

## 2016-06-04 NOTE — Progress Notes (Signed)
Update 4:30pm: Per RNCM, pt has been approved for CIR. CSW will sign off.  Cristobal Goldmannadia Raine Elsass BMWUXCSWA [phone]   2pm: Patient stated he had family in [place], but was not approved to go back there for his surgery. He works for [employer]. CSW left message for Workers Comp Sports coachcase manager at [phone]. Patient stated he could not afford to pay privately for SNF if CIR does not approve him. He was staying in a hotel in [place] for work when he came to the hospital and no longer has a home. CSW to follow up on discharge plans.  Osborne Cascoadia Mckenzie Toruno LCSWA [phone]

## 2016-06-04 NOTE — Consult Note (Signed)
Physical Medicine and Rehabilitation Consult  Reason for Consult: Back pain with radiculopathy/neuropathy RLE Referring Physician: Dr. Shon Baton.    HPI: Jacob Bond is a 60 y.o. 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 Failed conservative management. He was admitted on 06/03/16 for transforaminal L4-L5 fusion by Dr. Gayland Curry on 06/03/16. Therapy evaluations completed and patient requiring assistance with ADL tasks as well as mobility. CIR recommended for follow up therapy. He has associated weakness.   Patient lives in hotel in [place] and does not have a car. No local family or friends available to assist after discharge.    Review of Systems  HENT: Negative for hearing loss.   Eyes: Negative for blurred vision and double vision.  Respiratory: Negative for cough and hemoptysis.   Cardiovascular: Negative for chest pain and palpitations.  Gastrointestinal: Positive for abdominal pain and constipation. Negative for heartburn and nausea.  Genitourinary: Negative for dysuria.       Hesitancy --gets up 2-3 times at nights  Musculoskeletal: Positive for back pain and myalgias.  Skin: Negative for itching and rash.  Neurological: Positive for sensory change (Now bilateral thighs.  Continues to have radiation to RLE.), focal weakness (RLE weakness since injury) and weakness. Negative for dizziness and headaches.  Psychiatric/Behavioral: The patient is nervous/anxious. The patient does not have insomnia.   All other systems reviewed and are negative.     Past Medical History:  Diagnosis Date  . Asthma    child  . Back pain   . Diabetes mellitus without complication (HCC)   . Hypertension     Past Surgical History:  Procedure Laterality Date  . KNEE ARTHROSCOPY Right     History reviewed. No pertinent family history.    Social History: Lives alone--from [facility]. Independent without AD and  was working till a week ago. He  reports that he has never smoked. He has never used smokeless tobacco. He reports that he drinks alcohol 2-3 times a year. He reports that he does not use drugs.    Allergies  Allergen Reactions  . Other     Tide Soap    Medications Prior to Admission  Medication Sig Dispense Refill  . glipiZIDE (GLUCOTROL XL) 2.5 MG 24 hr tablet Take 2.5 mg by mouth daily with breakfast.    . ibuprofen (ADVIL,MOTRIN) 800 MG tablet Take 1 tablet (800 mg total) by mouth every 8 (eight) hours as needed. 30 tablet 0  . losartan-hydrochlorothiazide (HYZAAR) 100-25 MG tablet Take 1 tablet by mouth daily.    . metFORMIN (GLUCOPHAGE) 1000 MG tablet Take 1,000 mg by mouth 2 (two) times daily with a meal.    . Multiple Vitamin (MULTIVITAMIN WITH MINERALS) TABS tablet Take 1 tablet by mouth daily.    . pravastatin (PRAVACHOL) 20 MG tablet Take 20 mg by mouth daily.    . sildenafil (VIAGRA) 100 MG tablet Take 100 mg by mouth daily as needed for erectile dysfunction.      Home: Home Living Family/patient expects to be discharged to:: Unsure Living Arrangements: Alone Additional Comments: Pt lives in a hotel in [place], [place] to be near work. Pt has no permanent residence and has no family/friends in the immediate area to assist him. Pt's daughter lives in [place] and pt has a work friend in [place], [place] who would be willing to let him stay at her house, but she has many stairs there and works a lot.  Pt does not have a car.  Functional History: Prior Function Level of Independence: Independent Comments: Works in [employer]. Does not have a car now Functional Status:  Mobility: Bed Mobility Overal bed mobility: Needs Assistance Bed Mobility: Rolling, Sidelying to Sit Rolling: Min guard Sidelying to sit: Min assist General bed mobility comments: cues for sequence and assist to elevate trunk Transfers Overall transfer level: Needs assistance Equipment used:  None Transfers: Sit to/from Stand Sit to Stand: Min assist General transfer comment: cues for hand placement and sequence with assist at trunk to rise Ambulation/Gait Ambulation/Gait assistance: Min guard Ambulation Distance (Feet): 160 Feet Assistive device: Rolling walker (2 wheeled) Gait Pattern/deviations: Step-through pattern, Decreased stride length, Shuffle General Gait Details: cues for posture in RW, very slow guarded and painful gait Gait velocity interpretation: Below normal speed for age/gender    ADL: ADL Overall ADL's : Needs assistance/impaired Grooming: Wash/dry hands, Minimal assistance, Standing Upper Body Bathing: Minimal assitance, Sitting Lower Body Bathing: Maximal assistance, Sit to/from stand Upper Body Dressing : Minimal assistance, Sitting Upper Body Dressing Details (indicate cue type and reason): to don back brace Lower Body Dressing: Maximal assistance, Sit to/from stand Toilet Transfer: Minimal assistance, Cueing for safety, Ambulation, BSC Toileting- Clothing Manipulation and Hygiene: Minimal assistance, Sit to/from stand Functional mobility during ADLs: Minimal assistance  Cognition: Cognition Overall Cognitive Status: Within Functional Limits for tasks assessed Orientation Level: Oriented X4 Cognition Arousal/Alertness: Awake/alert Behavior During Therapy: WFL for tasks assessed/performed Overall Cognitive Status: Within Functional Limits for tasks assessed  Blood pressure 133/89, pulse 82, temperature 98.2 F (36.8 C), temperature source Oral, resp. rate 20, weight 85.3 kg (188 lb), SpO2 99 %. Physical Exam  Nursing note and vitals reviewed. Constitutional: He is oriented to person, place, and time. He appears well-developed and well-nourished.  Appears slightly confused and slowed  HENT:  Head: Normocephalic and atraumatic.  Mouth/Throat: Oropharynx is clear and moist.  Eyes: Conjunctivae and EOM are normal. Pupils are equal, round, and  reactive to light. No scleral icterus.  Neck: Normal range of motion. Neck supple.  Cardiovascular: Normal rate and regular rhythm.   No murmur heard. Respiratory: Effort normal and breath sounds normal. No respiratory distress. He has no wheezes.  GI: Soft. Bowel sounds are normal. He exhibits no distension. There is no tenderness.  Musculoskeletal: He exhibits no edema or tenderness.  Neurological: He is alert and oriented to person, place, and time. No cranial nerve deficit. Coordination abnormal.  Speech clear.  Pain with minnimal movements Motor: 4/5 throughout (pain inhibition) Sensation intact to light touch  Skin: Skin is warm and dry. He is not diaphoretic.  Back honeycomb dressing c/d/i.  Psychiatric: His speech is delayed. He is slowed.    Results for orders placed or performed during the hospital encounter of 06/03/16 (from the past 24 hour(s))  Hemoglobin A1c     Status: Abnormal   Collection Time: 06/03/16  3:02 PM  Result Value Ref Range   Hgb A1c MFr Bld 8.1 (H) 4.8 - 5.6 %   Mean Plasma Glucose 186 mg/dL  Glucose, capillary     Status: Abnormal   Collection Time: 06/03/16  3:29 PM  Result Value Ref Range   Glucose-Capillary 157 (H) 65 - 99 mg/dL   Comment 1 Notify RN    Comment 2 Document in Chart   Glucose, capillary     Status: Abnormal   Collection Time: 06/03/16  5:57 PM  Result Value Ref Range   Glucose-Capillary 165 (H)  65 - 99 mg/dL   Comment 1 Document in Chart   Glucose, capillary     Status: Abnormal   Collection Time: 06/03/16  9:35 PM  Result Value Ref Range   Glucose-Capillary 167 (H) 65 - 99 mg/dL   Comment 1 Notify RN    Comment 2 Document in Chart   Glucose, capillary     Status: Abnormal   Collection Time: 06/04/16  8:18 AM  Result Value Ref Range   Glucose-Capillary 150 (H) 65 - 99 mg/dL  Glucose, capillary     Status: Abnormal   Collection Time: 06/04/16 11:59 AM  Result Value Ref Range   Glucose-Capillary 172 (H) 65 - 99 mg/dL   Dg  Lumbar Spine 2-3 Views  Result Date: 06/04/2016 CLINICAL DATA:  Postoperative pain.  Lumbar fusion EXAM: LUMBAR SPINE - 2-3 VIEW COMPARISON:  Yesterday FINDINGS: Recent L4-5 discectomy with stable unremarkable positioning of intervertebral cage. Posterior fusion hardware is intact. Slight residual anterolisthesis is stable. No evidence of osseous fracture. Advanced L1-2 disc degeneration. IMPRESSION: No acute finding. Stable hardware positioning compared to placement fluoroscopy. Electronically Signed   By: Marnee SpringJonathon  Watts M.D.   On: 06/04/2016 07:35   Dg Lumbar Spine 2-3 Views  Result Date: 06/03/2016 CLINICAL DATA:  Lumbar disc herniation at L4-5.  Spondylolisthesis. EXAM: DG C-ARM 61-120 MIN; LUMBAR SPINE - 2-3 VIEW COMPARISON:  CT scan dated 05/23/2016 FINDINGS: AP and lateral C-arm images demonstrate the patient has undergone interbody and posterior fusion at L4-5. Spondylolisthesis is almost completely eliminated. Hardware appears in excellent position. IMPRESSION: Interbody and posterior fusion performed at L4-5. C-arm imaging provided intraoperatively. Electronically Signed   By: Francene BoyersJames  Maxwell M.D.   On: 06/03/2016 12:20   Dg C-arm 61-120 Min  Result Date: 06/03/2016 CLINICAL DATA:  Lumbar disc herniation at L4-5.  Spondylolisthesis. EXAM: DG C-ARM 61-120 MIN; LUMBAR SPINE - 2-3 VIEW COMPARISON:  CT scan dated 05/23/2016 FINDINGS: AP and lateral C-arm images demonstrate the patient has undergone interbody and posterior fusion at L4-5. Spondylolisthesis is almost completely eliminated. Hardware appears in excellent position. IMPRESSION: Interbody and posterior fusion performed at L4-5. C-arm imaging provided intraoperatively. Electronically Signed   By: Francene BoyersJames  Maxwell M.D.   On: 06/03/2016 12:20    Assessment/Plan: Diagnosis: Back pain with radiculopathy/neuropathy RLE Labs and images independently reviewed.  Records reviewed and summated above.  1. Does the need for close, 24 hr/day medical  supervision in concert with the patient's rehab needs make it unreasonable for this patient to be served in a less intensive setting? Will likely benefit 2. Co-Morbidities requiring supervision/potential complications: DM, HTN, chronic back pain 3. Due to bladder management, safety, skin/wound care, disease management, pain management and patient education, does the patient require 24 hr/day rehab nursing? Potentially 4. Does the patient require coordinated care of a physician, rehab nurse, PT (1-2 hrs/day, 5 days/week) and OT (1-2 hrs/day, 5 days/week) to address physical and functional deficits in the context of the above medical diagnosis(es)? Potentially Addressing deficits in the following areas: balance, endurance, locomotion, strength, transferring, bathing, dressing, toileting and psychosocial support 5. Can the patient actively participate in an intensive therapy program of at least 3 hrs of therapy per day at least 5 days per week? Yes 6. The potential for patient to make measurable gains while on inpatient rehab is good 7. Anticipated functional outcomes upon discharge from inpatient rehab are modified independent  with PT, modified independent with OT, n/a with SLP. 8. Estimated rehab length of stay to reach  the above functional goals is: 5-7 days. 9. Does the patient have adequate social supports and living environment to accommodate these discharge functional goals? Yes 10. Anticipated D/C setting: Home 11. Anticipated post D/C treatments: HH therapy and Home excercise program 12. Overall Rehab/Functional Prognosis: excellent and good  RECOMMENDATIONS: This patient's condition is appropriate for continued rehabilitative care in the following setting: CIR Patient has agreed to participate in recommended program. Yes Note that insurance prior authorization may be required for reimbursement for recommended care.  Comment: Rehab Admissions Coordinator to follow up.  Maryla Morrow,  MD 06/04/2016

## 2016-06-05 ENCOUNTER — Inpatient Hospital Stay (HOSPITAL_COMMUNITY)
Admission: AD | Admit: 2016-06-05 | Discharge: 2016-06-13 | DRG: 092 | Disposition: A | Discharge status: Home Health | Payer: Self-pay | Source: Intra-hospital | Attending: Physical Medicine & Rehabilitation | Admitting: Physical Medicine & Rehabilitation

## 2016-06-05 ENCOUNTER — Inpatient Hospital Stay (HOSPITAL_COMMUNITY): Payer: Self-pay

## 2016-06-05 DIAGNOSIS — E876 Hypokalemia: Secondary | ICD-10-CM | POA: Diagnosis not present

## 2016-06-05 DIAGNOSIS — N400 Enlarged prostate without lower urinary tract symptoms: Secondary | ICD-10-CM | POA: Diagnosis present

## 2016-06-05 DIAGNOSIS — Z981 Arthrodesis status: Secondary | ICD-10-CM

## 2016-06-05 DIAGNOSIS — D62 Acute posthemorrhagic anemia: Secondary | ICD-10-CM

## 2016-06-05 DIAGNOSIS — I10 Essential (primary) hypertension: Secondary | ICD-10-CM | POA: Diagnosis present

## 2016-06-05 DIAGNOSIS — E119 Type 2 diabetes mellitus without complications: Secondary | ICD-10-CM

## 2016-06-05 DIAGNOSIS — K5900 Constipation, unspecified: Secondary | ICD-10-CM

## 2016-06-05 DIAGNOSIS — G8929 Other chronic pain: Secondary | ICD-10-CM

## 2016-06-05 DIAGNOSIS — Z419 Encounter for procedure for purposes other than remedying health state, unspecified: Secondary | ICD-10-CM

## 2016-06-05 DIAGNOSIS — M5410 Radiculopathy, site unspecified: Secondary | ICD-10-CM

## 2016-06-05 DIAGNOSIS — Z79899 Other long term (current) drug therapy: Secondary | ICD-10-CM

## 2016-06-05 DIAGNOSIS — Z91048 Other nonmedicinal substance allergy status: Secondary | ICD-10-CM

## 2016-06-05 DIAGNOSIS — M549 Dorsalgia, unspecified: Secondary | ICD-10-CM

## 2016-06-05 DIAGNOSIS — Z7984 Long term (current) use of oral hypoglycemic drugs: Secondary | ICD-10-CM

## 2016-06-05 DIAGNOSIS — R319 Hematuria, unspecified: Secondary | ICD-10-CM

## 2016-06-05 DIAGNOSIS — M5442 Lumbago with sciatica, left side: Secondary | ICD-10-CM

## 2016-06-05 DIAGNOSIS — N029 Recurrent and persistent hematuria with unspecified morphologic changes: Secondary | ICD-10-CM | POA: Diagnosis not present

## 2016-06-05 DIAGNOSIS — M5441 Lumbago with sciatica, right side: Secondary | ICD-10-CM

## 2016-06-05 DIAGNOSIS — D72829 Elevated white blood cell count, unspecified: Secondary | ICD-10-CM

## 2016-06-05 DIAGNOSIS — E1140 Type 2 diabetes mellitus with diabetic neuropathy, unspecified: Secondary | ICD-10-CM | POA: Diagnosis present

## 2016-06-05 DIAGNOSIS — M4316 Spondylolisthesis, lumbar region: Secondary | ICD-10-CM | POA: Diagnosis present

## 2016-06-05 DIAGNOSIS — R269 Unspecified abnormalities of gait and mobility: Principal | ICD-10-CM | POA: Diagnosis present

## 2016-06-05 DIAGNOSIS — N281 Cyst of kidney, acquired: Secondary | ICD-10-CM | POA: Diagnosis present

## 2016-06-05 DIAGNOSIS — M792 Neuralgia and neuritis, unspecified: Secondary | ICD-10-CM

## 2016-06-05 LAB — GLUCOSE, CAPILLARY
GLUCOSE-CAPILLARY: 152 mg/dL — AB (ref 65–99)
GLUCOSE-CAPILLARY: 205 mg/dL — AB (ref 65–99)
Glucose-Capillary: 142 mg/dL — ABNORMAL HIGH (ref 65–99)
Glucose-Capillary: 170 mg/dL — ABNORMAL HIGH (ref 65–99)

## 2016-06-05 MED ORDER — MENTHOL 3 MG MT LOZG: 1.0000 | LOZENGE | OROMUCOSAL | Status: DC | PRN

## 2016-06-05 MED ORDER — ENOXAPARIN SODIUM 40 MG/0.4ML ~~LOC~~ SOLN
40.0000 mg | SUBCUTANEOUS | Status: DC
  Administered 2016-06-05 – 2016-06-08 (×4): 40 mg via SUBCUTANEOUS
  Filled 2016-06-05 (×4): qty 0.4

## 2016-06-05 MED ORDER — ACETAMINOPHEN 325 MG PO TABS: 325.0000 mg | ORAL_TABLET | ORAL | Status: DC | PRN

## 2016-06-05 MED ORDER — INSULIN ASPART 100 UNIT/ML ~~LOC~~ SOLN
0.0000 [IU] | Freq: Three times a day (TID) | SUBCUTANEOUS | Status: DC
  Administered 2016-06-06 (×2): 2 [IU] via SUBCUTANEOUS
  Administered 2016-06-07: 3 [IU] via SUBCUTANEOUS
  Administered 2016-06-07: 2 [IU] via SUBCUTANEOUS
  Administered 2016-06-08 – 2016-06-09 (×2): 3 [IU] via SUBCUTANEOUS
  Administered 2016-06-10: 2 [IU] via SUBCUTANEOUS
  Administered 2016-06-10: 3 [IU] via SUBCUTANEOUS

## 2016-06-05 MED ORDER — HYDROCHLOROTHIAZIDE 25 MG PO TABS
25.0000 mg | ORAL_TABLET | Freq: Every day | ORAL | Status: DC
  Administered 2016-06-06 – 2016-06-13 (×8): 25 mg via ORAL
  Filled 2016-06-05 (×8): qty 1

## 2016-06-05 MED ORDER — ALUM & MAG HYDROXIDE-SIMETH 200-200-20 MG/5ML PO SUSP: 30.0000 mL | ORAL | Status: DC | PRN

## 2016-06-05 MED ORDER — GUAIFENESIN-DM 100-10 MG/5ML PO SYRP: 5.0000 mL | ORAL_SOLUTION | Freq: Four times a day (QID) | ORAL | Status: DC | PRN

## 2016-06-05 MED ORDER — LOSARTAN POTASSIUM 50 MG PO TABS
100.0000 mg | ORAL_TABLET | Freq: Every day | ORAL | Status: DC
  Administered 2016-06-06 – 2016-06-13 (×8): 100 mg via ORAL
  Filled 2016-06-05 (×9): qty 2

## 2016-06-05 MED ORDER — METFORMIN HCL 500 MG PO TABS
1000.0000 mg | ORAL_TABLET | Freq: Two times a day (BID) | ORAL | Status: DC
  Administered 2016-06-06 – 2016-06-13 (×13): 1000 mg via ORAL
  Filled 2016-06-05 (×14): qty 2

## 2016-06-05 MED ORDER — PRAVASTATIN SODIUM 20 MG PO TABS
20.0000 mg | ORAL_TABLET | Freq: Every day | ORAL | Status: DC
  Administered 2016-06-06 – 2016-06-13 (×8): 20 mg via ORAL
  Filled 2016-06-05 (×8): qty 1

## 2016-06-05 MED ORDER — DIPHENHYDRAMINE HCL 12.5 MG/5ML PO ELIX: 12.5000 mg | ORAL_SOLUTION | Freq: Four times a day (QID) | ORAL | Status: DC | PRN

## 2016-06-05 MED ORDER — GABAPENTIN 100 MG PO CAPS
100.0000 mg | ORAL_CAPSULE | Freq: Three times a day (TID) | ORAL | Status: DC
  Administered 2016-06-05 – 2016-06-08 (×8): 100 mg via ORAL
  Filled 2016-06-05 (×8): qty 1

## 2016-06-05 MED ORDER — GLIPIZIDE ER 2.5 MG PO TB24
2.5000 mg | ORAL_TABLET | Freq: Every day | ORAL | Status: DC
  Administered 2016-06-06 – 2016-06-13 (×8): 2.5 mg via ORAL
  Filled 2016-06-05 (×8): qty 1

## 2016-06-05 MED ORDER — PROCHLORPERAZINE MALEATE 5 MG PO TABS: 5.0000 mg | ORAL_TABLET | Freq: Four times a day (QID) | ORAL | Status: DC | PRN

## 2016-06-05 MED ORDER — OXYCODONE HCL 5 MG PO TABS
10.0000 mg | ORAL_TABLET | ORAL | Status: DC | PRN
  Administered 2016-06-05 – 2016-06-13 (×24): 10 mg via ORAL
  Filled 2016-06-05 (×25): qty 2

## 2016-06-05 MED ORDER — TRAZODONE HCL 50 MG PO TABS: 25.0000 mg | ORAL_TABLET | Freq: Every evening | ORAL | Status: DC | PRN

## 2016-06-05 MED ORDER — LUBIPROSTONE 24 MCG PO CAPS
24.0000 ug | ORAL_CAPSULE | Freq: Two times a day (BID) | ORAL | Status: DC
  Administered 2016-06-05 – 2016-06-13 (×14): 24 ug via ORAL
  Filled 2016-06-05 (×17): qty 1

## 2016-06-05 MED ORDER — PHENOL 1.4 % MT LIQD: 1.0000 | OROMUCOSAL | Status: DC | PRN

## 2016-06-05 MED ORDER — FLEET ENEMA 7-19 GM/118ML RE ENEM
1.0000 | ENEMA | Freq: Once | RECTAL | Status: AC
  Administered 2016-06-05: 1 via RECTAL
  Filled 2016-06-05: qty 1

## 2016-06-05 MED ORDER — POLYETHYLENE GLYCOL 3350 17 G PO PACK
17.0000 g | PACK | Freq: Every day | ORAL | Status: DC
  Administered 2016-06-05 – 2016-06-08 (×4): 17 g via ORAL
  Filled 2016-06-05 (×4): qty 1

## 2016-06-05 MED ORDER — BISACODYL 10 MG RE SUPP: 10.0000 mg | Freq: Every day | RECTAL | Status: DC | PRN

## 2016-06-05 MED ORDER — PROCHLORPERAZINE EDISYLATE 5 MG/ML IJ SOLN: 5.0000 mg | Freq: Four times a day (QID) | INTRAMUSCULAR | Status: DC | PRN

## 2016-06-05 MED ORDER — MAGNESIUM CITRATE PO SOLN
1.0000 | Freq: Once | ORAL | Status: AC
  Administered 2016-06-05: 1 via ORAL
  Filled 2016-06-05: qty 296

## 2016-06-05 MED ORDER — FLEET ENEMA 7-19 GM/118ML RE ENEM: 1.0000 | ENEMA | Freq: Once | RECTAL | Status: DC | PRN

## 2016-06-05 MED ORDER — PROCHLORPERAZINE 25 MG RE SUPP: 12.5000 mg | Freq: Four times a day (QID) | RECTAL | Status: DC | PRN

## 2016-06-05 NOTE — Progress Notes (Signed)
Ankit Karis JubaAnil Patel, MD Physician Signed Physical Medicine and Rehabilitation  Consult Note Date of Service: 06/04/2016 2:29 PM  Related encounter: Admission (Current) from 06/03/2016 in [facility]     Expand All Collapse All   [] Hide copied text [] Hover for attribution information     Physical Medicine and Rehabilitation Consult  Reason for Consult: Back pain with radiculopathy/neuropathy RLE Referring Physician: Dr. Shon BatonBrooks.    HPI: Jacob Bond is a 60 y.o. 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 Failed conservative management. He was admitted on 06/03/16 for transforaminal L4-L5 fusion by Dr. Gayland CurryBoorks on 06/03/16. Therapy evaluations completed and patient requiring assistance with ADL tasks as well as mobility. CIR recommended for follow up therapy. He has associated weakness.   Patient lives in hotel in [place] and does not have a car. No local family or friends available to assist after discharge.    Review of Systems  HENT: Negative for hearing loss.   Eyes: Negative for blurred vision and double vision.  Respiratory: Negative for cough and hemoptysis.   Cardiovascular: Negative for chest pain and palpitations.  Gastrointestinal: Positive for abdominal pain and constipation. Negative for heartburn and nausea.  Genitourinary: Negative for dysuria.       Hesitancy --gets up 2-3 times at nights  Musculoskeletal: Positive for back pain and myalgias.  Skin: Negative for itching and rash.  Neurological: Positive for sensory change (Now bilateral thighs.  Continues to have radiation to RLE.), focal weakness (RLE weakness since injury) and weakness. Negative for dizziness and headaches.  Psychiatric/Behavioral: The patient is nervous/anxious. The patient does not have insomnia.   All other systems reviewed and are negative.         Past Medical History:    Diagnosis Date  . Asthma    child  . Back pain   . Diabetes mellitus without complication (HCC)   . Hypertension          Past Surgical History:  Procedure Laterality Date  . KNEE ARTHROSCOPY Right     History reviewed. No pertinent family history.    Social History: Lives alone--[facility]. Independent without AD and was working till a week ago. He  reports that he has never smoked. He has never used smokeless tobacco. He reports that he drinks alcohol 2-3 times a year. He reports that he does not use drugs.         Allergies  Allergen Reactions  . Other     Tide Soap          Medications Prior to Admission  Medication Sig Dispense Refill  . glipiZIDE (GLUCOTROL XL) 2.5 MG 24 hr tablet Take 2.5 mg by mouth daily with breakfast.    . ibuprofen (ADVIL,MOTRIN) 800 MG tablet Take 1 tablet (800 mg total) by mouth every 8 (eight) hours as needed. 30 tablet 0  . losartan-hydrochlorothiazide (HYZAAR) 100-25 MG tablet Take 1 tablet by mouth daily.    . metFORMIN (GLUCOPHAGE) 1000 MG tablet Take 1,000 mg by mouth 2 (two) times daily with a meal.    . Multiple Vitamin (MULTIVITAMIN WITH MINERALS) TABS tablet Take 1 tablet by mouth daily.    . pravastatin (PRAVACHOL) 20 MG tablet Take 20 mg by mouth daily.    . sildenafil (VIAGRA) 100 MG tablet Take 100 mg by mouth daily as needed for erectile dysfunction.      Home: Home Living Family/patient expects  to be discharged to:: Unsure Living Arrangements: Alone Additional Comments: Pt lives in a hotel in [place], [place] to be near work. Pt has no permanent residence and has no family/friends in the immediate area to assist him. Pt's daughter lives in [place] and pt has a work friend in [place], [place] who would be willing to let him stay at her house, but she has many stairs there and works a lot. Pt does not have a car.  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 have a car now Functional Status:  Mobility: Bed Mobility Overal bed mobility: Needs Assistance Bed Mobility: Rolling, Sidelying to Sit Rolling: Min guard Sidelying to sit: Min assist General bed mobility comments: cues for sequence and assist to elevate trunk Transfers Overall transfer level: Needs assistance Equipment used: None Transfers: Sit to/from Stand Sit to Stand: Min assist General transfer comment: cues for hand placement and sequence with assist at trunk to rise Ambulation/Gait Ambulation/Gait assistance: Min guard Ambulation Distance (Feet): 160 Feet Assistive device: Rolling walker (2 wheeled) Gait Pattern/deviations: Step-through pattern, Decreased stride length, Shuffle General Gait Details: cues for posture in RW, very slow guarded and painful gait Gait velocity interpretation: Below normal speed for age/gender    ADL: ADL Overall ADL's : Needs assistance/impaired Grooming: Wash/dry hands, Minimal assistance, Standing Upper Body Bathing: Minimal assitance, Sitting Lower Body Bathing: Maximal assistance, Sit to/from stand Upper Body Dressing : Minimal assistance, Sitting Upper Body Dressing Details (indicate cue type and reason): to don back brace Lower Body Dressing: Maximal assistance, Sit to/from stand Toilet Transfer: Minimal assistance, Cueing for safety, Ambulation, BSC Toileting- Clothing Manipulation and Hygiene: Minimal assistance, Sit to/from stand Functional mobility during ADLs: Minimal assistance  Cognition: Cognition Overall Cognitive Status: Within Functional Limits for tasks assessed Orientation Level: Oriented X4 Cognition Arousal/Alertness: Awake/alert Behavior During Therapy: WFL for tasks assessed/performed Overall Cognitive Status: Within Functional Limits for tasks assessed  Blood pressure 133/89, pulse 82, temperature 98.2 F (36.8 C), temperature source Oral, resp. rate 20, weight 85.3  kg (188 lb), SpO2 99 %. Physical Exam  Nursing note and vitals reviewed. Constitutional: He is oriented to person, place, and time. He appears well-developed and well-nourished.  Appears slightly confused and slowed  HENT:  Head: Normocephalic and atraumatic.  Mouth/Throat: Oropharynx is clear and moist.  Eyes: Conjunctivae and EOM are normal. Pupils are equal, round, and reactive to light. No scleral icterus.  Neck: Normal range of motion. Neck supple.  Cardiovascular: Normal rate and regular rhythm.   No murmur heard. Respiratory: Effort normal and breath sounds normal. No respiratory distress. He has no wheezes.  GI: Soft. Bowel sounds are normal. He exhibits no distension. There is no tenderness.  Musculoskeletal: He exhibits no edema or tenderness.  Neurological: He is alert and oriented to person, place, and time. No cranial nerve deficit. Coordination abnormal.  Speech clear.  Pain with minnimal movements Motor: 4/5 throughout (pain inhibition) Sensation intact to light touch  Skin: Skin is warm and dry. He is not diaphoretic.  Back honeycomb dressing c/d/i.  Psychiatric: His speech is delayed. He is slowed.    Lab Results Last 24 Hours       Results for orders placed or performed during the hospital encounter of 06/03/16 (from the past 24 hour(s))  Hemoglobin A1c     Status: Abnormal   Collection Time: 06/03/16  3:02 PM  Result Value Ref Range   Hgb A1c MFr Bld 8.1 (H)  4.8 - 5.6 %   Mean Plasma Glucose 186 mg/dL  Glucose, capillary     Status: Abnormal   Collection Time: 06/03/16  3:29 PM  Result Value Ref Range   Glucose-Capillary 157 (H) 65 - 99 mg/dL   Comment 1 Notify RN    Comment 2 Document in Chart   Glucose, capillary     Status: Abnormal   Collection Time: 06/03/16  5:57 PM  Result Value Ref Range   Glucose-Capillary 165 (H) 65 - 99 mg/dL   Comment 1 Document in Chart   Glucose, capillary     Status: Abnormal   Collection Time: 06/03/16   9:35 PM  Result Value Ref Range   Glucose-Capillary 167 (H) 65 - 99 mg/dL   Comment 1 Notify RN    Comment 2 Document in Chart   Glucose, capillary     Status: Abnormal   Collection Time: 06/04/16  8:18 AM  Result Value Ref Range   Glucose-Capillary 150 (H) 65 - 99 mg/dL  Glucose, capillary     Status: Abnormal   Collection Time: 06/04/16 11:59 AM  Result Value Ref Range   Glucose-Capillary 172 (H) 65 - 99 mg/dL      Imaging Results (Last 48 hours)  Dg Lumbar Spine 2-3 Views  Result Date: 06/04/2016 CLINICAL DATA:  Postoperative pain.  Lumbar fusion EXAM: LUMBAR SPINE - 2-3 VIEW COMPARISON:  Yesterday FINDINGS: Recent L4-5 discectomy with stable unremarkable positioning of intervertebral cage. Posterior fusion hardware is intact. Slight residual anterolisthesis is stable. No evidence of osseous fracture. Advanced L1-2 disc degeneration. IMPRESSION: No acute finding. Stable hardware positioning compared to placement fluoroscopy. Electronically Signed   By: Marnee Spring M.D.   On: 06/04/2016 07:35   Dg Lumbar Spine 2-3 Views  Result Date: 06/03/2016 CLINICAL DATA:  Lumbar disc herniation at L4-5.  Spondylolisthesis. EXAM: DG C-ARM 61-120 MIN; LUMBAR SPINE - 2-3 VIEW COMPARISON:  CT scan dated 05/23/2016 FINDINGS: AP and lateral C-arm images demonstrate the patient has undergone interbody and posterior fusion at L4-5. Spondylolisthesis is almost completely eliminated. Hardware appears in excellent position. IMPRESSION: Interbody and posterior fusion performed at L4-5. C-arm imaging provided intraoperatively. Electronically Signed   By: Francene Boyers M.D.   On: 06/03/2016 12:20   Dg C-arm 61-120 Min  Result Date: 06/03/2016 CLINICAL DATA:  Lumbar disc herniation at L4-5.  Spondylolisthesis. EXAM: DG C-ARM 61-120 MIN; LUMBAR SPINE - 2-3 VIEW COMPARISON:  CT scan dated 05/23/2016 FINDINGS: AP and lateral C-arm images demonstrate the patient has undergone interbody and posterior  fusion at L4-5. Spondylolisthesis is almost completely eliminated. Hardware appears in excellent position. IMPRESSION: Interbody and posterior fusion performed at L4-5. C-arm imaging provided intraoperatively. Electronically Signed   By: Francene Boyers M.D.   On: 06/03/2016 12:20     Assessment/Plan: Diagnosis: Back pain with radiculopathy/neuropathy RLE Labs and images independently reviewed.  Records reviewed and summated above.  1. Does the need for close, 24 hr/day medical supervision in concert with the patient's rehab needs make it unreasonable for this patient to be served in a less intensive setting? Will likely benefit 2. Co-Morbidities requiring supervision/potential complications: DM, HTN, chronic back pain 3. Due to bladder management, safety, skin/wound care, disease management, pain management and patient education, does the patient require 24 hr/day rehab nursing? Potentially 4. Does the patient require coordinated care of a physician, rehab nurse, PT (1-2 hrs/day, 5 days/week) and OT (1-2 hrs/day, 5 days/week) to address physical and functional deficits in the context of  the above medical diagnosis(es)? Potentially Addressing deficits in the following areas: balance, endurance, locomotion, strength, transferring, bathing, dressing, toileting and psychosocial support 5. Can the patient actively participate in an intensive therapy program of at least 3 hrs of therapy per day at least 5 days per week? Yes 6. The potential for patient to make measurable gains while on inpatient rehab is good 7. Anticipated functional outcomes upon discharge from inpatient rehab are modified independent  with PT, modified independent with OT, n/a with SLP. 8. Estimated rehab length of stay to reach the above functional goals is: 5-7 days. 9. Does the patient have adequate social supports and living environment to accommodate these discharge functional goals? Yes 10. Anticipated D/C setting:  Home 11. Anticipated post D/C treatments: HH therapy and Home excercise program 12. Overall Rehab/Functional Prognosis: excellent and good  RECOMMENDATIONS: This patient's condition is appropriate for continued rehabilitative care in the following setting: CIR Patient has agreed to participate in recommended program. Yes Note that insurance prior authorization may be required for reimbursement for recommended care.  Comment: Rehab Admissions Coordinator to follow up.  Maryla Morrow, MD 06/04/2016    Revision History                        Routing History

## 2016-06-05 NOTE — Progress Notes (Signed)
Trish Mage, RN Rehab Admission Coordinator Signed Physical Medicine and Rehabilitation  PMR Pre-admission Date of Service: 06/05/2016 12:16 PM  Related encounter: Admission (Current) from 06/03/2016 in [facility]       [] Hide copied text PMR Admission Coordinator Pre-Admission Assessment  Patient: Jacob Bond is an 60 y.o., male MRN: [id number] DOB: July 28, 1956 Height:   Weight: 85.3 kg (188 lb)                                                                                                                                                                                                                                                                          Insurance Information HMO:     PPO:       PCP:       IPA:       80/20:       OTHER:   PRIMARY:  Generic workers comp      Policy#: [id number]      Subscriber: Gita Kudo                      CM Name: Cira Rue      Phone#: [phone]     Fax#: [phone] Pre-Cert#: [id number]      Employer: [employer] - FT Benefits:  Phone #: [phone]     Name: Veverly Fells. Date: 10-2015     Deduct:        Out of Pocket Max:        Life Max:   CIR:        SNF:   Outpatient:       Co-Pay:   Home Health:        Co-Pay:   DME:       Co-Pay:   Providers: Adjuster is Carmell Austria # [phone], fax [phone]   Emergency Contact Information        Contact Information    Name Relation Home Work [place] Daughter [phone]     Crite,Barbara Mother [phone]     Peterson Lombard [phone]       Current Medical History  Patient Admitting Diagnosis:  L4-5 Fusion  History of Present Illness: A 60  y.o.malewith history of DM, HTN, back injury with RLE neuropathy with work up revealing 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 Failed conservative management. He was admitted on 06/03/16 for transforaminal L4-L5 fusion by Dr. Gayland Curry on 06/03/16. Therapy evaluations  completed and patient requiring assistance with ADL tasks as well as mobility. CIR recommended for follow up therapy. He has associated weakness.   Patient lives in hotel in [place] and does not have a car. No local family or friends available to assist after discharge.    Past Medical History      Past Medical History:  Diagnosis Date  . Asthma    child  . Back pain   . Diabetes mellitus without complication (HCC)   . Hypertension     Family History  family history is not on file.  Prior Rehab/Hospitalizations: Had outpatient therapy in June 2017 for 3-4 weeks due to back injury.  Has the patient had major surgery during 100 days prior to admission? No  Current Medications   Current Facility-Administered Medications:  .  0.9 %  sodium chloride infusion, 250 mL, Intravenous, Continuous, Venita Lick, MD .  glipiZIDE (GLUCOTROL XL) 24 hr tablet 2.5 mg, 2.5 mg, Oral, Q breakfast, Venita Lick, MD, 2.5 mg at 06/05/16 0915 .  losartan (COZAAR) tablet 100 mg, 100 mg, Oral, Daily, 100 mg at 06/05/16 0913 **AND** hydrochlorothiazide (HYDRODIURIL) tablet 25 mg, 25 mg, Oral, Daily, Bertram Millard, RPH, 25 mg at 06/05/16 0913 .  insulin aspart (novoLOG) injection 0-15 Units, 0-15 Units, Subcutaneous, TID WC, Venita Lick, MD, 3 Units at 06/05/16 0915 .  lactated ringers infusion, , Intravenous, Continuous, Venita Lick, MD .  lubiprostone (AMITIZA) capsule 24 mcg, 24 mcg, Oral, BID WC, Venita Lick, MD, 24 mcg at 06/05/16 0914 .  magnesium citrate solution 1 Bottle, 1 Bottle, Oral, Once, Venita Lick, MD .  menthol-cetylpyridinium (CEPACOL) lozenge 3 mg, 1 lozenge, Oral, PRN **OR** phenol (CHLORASEPTIC) mouth spray 1 spray, 1 spray, Mouth/Throat, PRN, Venita Lick, MD .  metFORMIN (GLUCOPHAGE) tablet 1,000 mg, 1,000 mg, Oral, BID WC, Venita Lick, MD, 1,000 mg at 06/05/16 0913 .  methocarbamol (ROBAXIN) tablet 500 mg, 500 mg, Oral, Q6H PRN, 500 mg at 06/05/16 0913  **OR** methocarbamol (ROBAXIN) 500 mg in dextrose 5 % 50 mL IVPB, 500 mg, Intravenous, Q6H PRN, Venita Lick, MD .  morphine 2 MG/ML injection 1-4 mg, 1-4 mg, Intravenous, Q3H PRN, Venita Lick, MD, 4 mg at 06/03/16 1519 .  ondansetron (ZOFRAN) injection 4 mg, 4 mg, Intravenous, Q4H PRN, Venita Lick, MD .  oxyCODONE (Oxy IR/ROXICODONE) immediate release tablet 10 mg, 10 mg, Oral, Q4H PRN, Venita Lick, MD, 10 mg at 06/05/16 0914 .  pravastatin (PRAVACHOL) tablet 20 mg, 20 mg, Oral, Daily, Venita Lick, MD, 20 mg at 06/05/16 0914 .  sodium chloride flush (NS) 0.9 % injection 3 mL, 3 mL, Intravenous, Q12H, Venita Lick, MD, 3 mL at 06/03/16 2200 .  sodium chloride flush (NS) 0.9 % injection 3 mL, 3 mL, Intravenous, PRN, Venita Lick, MD  Patients Current Diet: Diet Carb Modified Fluid consistency: Thin; Room service appropriate? 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  On light duty and last worked 05/22/16  Home Assistive Devices / Equipment    Prior Device Use: Indicate devices/aids used by the patient prior to current illness, exacerbation or injury? None  Prior Functional Level Prior Function Level of Independence: Independent Comments: Works in Science writerroad crew maintenance. Does not have a car now  Self Care: Did the patient need help bathing, dressing, using the toilet or eating?  Independent  Indoor Mobility: Did the patient need assistance with walking from room to room (with or without device)? Independent  Stairs: Did the  patient need assistance with internal or external stairs (with or without device)? Independent  Functional Cognition: Did the patient need help planning regular tasks such as shopping or remembering to take medications? Independent  Current Functional Level Cognition Overall Cognitive Status: Within Functional Limits for tasks assessed Orientation Level: Oriented X4    Extremity Assessment (includes Sensation/Coordination) Upper Extremity Assessment: Defer to OT evaluation  Lower Extremity Assessment: Generalized weakness, Overall WFL for tasks assessed   ADLs Overall ADL's : Needs assistance/impaired Grooming: Wash/dry hands, Minimal assistance, Standing Upper Body Bathing: Minimal assitance, Sitting Lower Body Bathing: Maximal assistance, Sit to/from stand Upper Body Dressing : Minimal assistance, Sitting Upper Body Dressing Details (indicate cue type and reason): to don back brace Lower Body Dressing: Maximal assistance, Sit to/from stand Toilet Transfer: Minimal assistance, Cueing for safety, Ambulation, BSC Toileting- Clothing Manipulation and Hygiene: Minimal assistance, Sit to/from stand Functional mobility during ADLs: Minimal assistance   Mobility Overal bed mobility: Needs Assistance Bed Mobility: Rolling, Sidelying to Sit Rolling: Min guard Sidelying to sit: Min assist General bed mobility comments: cues for sequence and assist to elevate trunk   Transfers Overall transfer level: Needs assistance Equipment used: None Transfers: Sit to/from Stand Sit to Stand: Supervision General transfer comment: cues for hand placement and sequence    Ambulation / Gait / Stairs / Wheelchair Mobility Ambulation/Gait Ambulation/Gait assistance: Hydrographic surveyorMin guard Ambulation Distance (Feet): 210 Feet Assistive device: Rolling walker (2 wheeled) Gait Pattern/deviations: Step-through pattern, Decreased stride length, Narrow base of support General Gait Details: cues for posture in RW, very slow  guarded and painful gait requiring 12 min to complete limited gait distance. Pt stopping periodically to elevate one foot then the other to decrease pressure and pain Gait velocity interpretation: Below normal speed for age/gender   Posture / Balance Balance Overall balance assessment: Needs assistance Sitting-balance support: No upper extremity supported, Feet supported Sitting balance-Leahy Scale: Good Standing balance support: No upper extremity supported, During functional activity Standing balance-Leahy Scale: Fair   Special needs/care consideration BiPAP/CPAP No CPM No Continuous Drip IV No Dialysis No      Life Vest No Oxygen No Special Bed No Trach Size No Wound Vac (area) No     Skin Has surgical incision lumbar back                             Bowel mgmt: Patient reports only small BM since 06/03/16 Bladder mgmt: Voiding in bathroom with assistance Diabetic mgmt Yes, on oral medications at home.  On insulin in the hospital.   Previous Home Environment Living Arrangements: Alone  Lives With: Alone Additional Comments: Pt lives in a hotel in [place], [place] to be near work. Pt has no permanent residence and has no family/friends in the immediate area to assist him. Pt's daughter lives in [place] and pt has a work friend  in [place], [place] who would be willing to let him stay at her house, but she has many stairs there and works a lot. Pt does not have a car.  Discharge Living Setting Plans for Discharge Living Setting: Other (Comment) (May go to hotel.  May go to daughters townhouse in [place].) Type of Home at Discharge: Other (Comment) (Workers comp may assist with post rehab housing.)  Social/Family/Support Systems Patient Roles: Parent (Separated from wife, has a daughter in [place].) Contact Information: Thayer Dallas - mom - [phone] Anticipated Caregiver: self, may return to hotel or may go home with daughter in [place] Anticipated Caregiver's Contact Information:  [place] - daughter [phone] Ability/Limitations of Caregiver: Dtr in [place] works.  Mom is in [place] in [place]. Caregiver Availability: Other (Comment) (Not sure of any caregiver support at this time.) Discharge Plan Discussed with Primary Caregiver: Yes Is Caregiver In Agreement with Plan?: Yes Does Caregiver/Family have Issues with Lodging/Transportation while Pt is in Rehab?: Yes (All family are out of town.)  Goals/Additional Needs Patient/Family Goal for Rehab: PT/OT mod I goals Expected length of stay: 5-7 days Cultural Considerations: Holiness Dietary Needs: Carb mod, med calorie, thin liquids Equipment Needs: TBD Pt/Family Agrees to Admission and willing to participate: Yes Program Orientation Provided & Reviewed with Pt/Caregiver Including Roles  & Responsibilities: Yes  Decrease burden of Care through IP rehab admission: N/A  Possible need for SNF placement upon discharge: Not anticipated  Patient Condition: This patient's condition remains as documented in the consult dated 06/04/16, in which the Rehabilitation Physician determined and documented that the patient's condition is appropriate for intensive rehabilitative care in an inpatient rehabilitation facility. Will admit to inpatient rehab today.   Preadmission Screen Completed By:  Trish Mage, 06/05/2016 12:32 PM ______________________________________________________________________   Discussed status with Dr. Allena Katz on 06/05/16 at 1231 and received telephone approval for admission today.  Admission Coordinator:  Trish Mage, time1231/Date08/11/17       Cosigned by: Ankit Karis Juba, MD at 06/05/2016 12:44 PM  Revision History

## 2016-06-05 NOTE — Progress Notes (Signed)
    Subjective: Procedure(s) (LRB): TRANSFORAMINAL LUMBAR INTERBODY FUSION (TLIF) WITH PEDICLE SCREW FIXATION 1 LEVEL (N/A) 2 Days Post-Op  Patient reports pain as 2 on 0-10 scale.  Reports decreased leg pain reports incisional back pain   Positive void Negative bowel movement Positive flatus Negative chest pain or shortness of breath  Objective: Vital signs in last 24 hours: Temp:  [98.2 F (36.8 C)-100.7 F (38.2 C)] 99.4 F (37.4 C) (08/11 0343) Pulse Rate:  [78-107] 78 (08/11 0343) Resp:  [18-20] 18 (08/11 0343) BP: (116-133)/(71-89) 128/79 (08/11 0343) SpO2:  [94 %-100 %] 98 % (08/11 0343)  Intake/Output from previous day: 08/10 0701 - 08/11 0700 In: 1080 [P.O.:1080] Out: -   Labs: No results for input(s): WBC, RBC, HCT, PLT in the last 72 hours. No results for input(s): NA, K, CL, CO2, BUN, CREATININE, GLUCOSE, CALCIUM in the last 72 hours. No results for input(s): LABPT, INR in the last 72 hours.  Physical Exam: Neurologically intact Intact pulses distally Incision: dressing C/D/I Compartment soft  Assessment/Plan: Patient stable  xrays satisfactory Continue mobilization with physical therapy Continue care  Advance diet Up with therapy  Patient was seen yesterday - however do to an over site on my behalf I did not place a note He is improved from yesterday.  Ambulating length of hall Pain improved  Agree with CIR Plan on d/c today   Venita Lickahari Latifa Noble, MD [facility] [phone]

## 2016-06-05 NOTE — H&P (Signed)
Physical Medicine and Rehabilitation Admission H&P    CC: Back pain with radiculopathy RLE  HPI:  Jacob Bond is a 60 y.o. 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 Failed conservative management. He was admitted on 06/03/16 for transforaminal L4-L5 fusion by Dr. Gayland Curry on 06/03/16. Therapy evaluations completed and patient requiring assistance with ADL tasks as well as mobility.  Post op has had neuropathy BLE as well as pain affecting mobility. CIR recommended for follow up therapy.  Review of Systems  HENT: Negative for hearing loss.   Eyes: Negative for blurred vision and double vision.  Respiratory: Positive for cough. Negative for shortness of breath.   Cardiovascular: Negative for chest pain and palpitations.  Gastrointestinal: Positive for abdominal pain and constipation. Negative for nausea.  Genitourinary: Negative for dysuria and urgency.       Hesitancy. Gets up 2-3 times at night   Musculoskeletal: Positive for back pain.  Skin: Negative for itching and rash.  Neurological: Positive for dizziness, tingling, sensory change, focal weakness and weakness. Negative for headaches.  All other systems reviewed and are negative.   Past Medical History:  Diagnosis Date  . Asthma    child  . Back pain   . Diabetes mellitus without complication (HCC)   . Hypertension     Past Surgical History:  Procedure Laterality Date  . KNEE ARTHROSCOPY Right   . TRANSFORAMINAL LUMBAR INTERBODY FUSION (TLIF) WITH PEDICLE SCREW FIXATION 1 LEVEL N/A 06/03/2016   Procedure: TRANSFORAMINAL LUMBAR INTERBODY FUSION (TLIF) WITH PEDICLE SCREW FIXATION 1 LEVEL;  Surgeon: Venita Lick, MD;  Location: [facility] OR;  Service: Orthopedics;  Laterality: N/A;    History reviewed. No pertinent family history.    Social History:  Lives alone--from [facility]. Independent without AD and was working till a  week ago. He  reports that he has never smoked. He has never used smokeless tobacco. He reports that he drinks alcohol 2-3 times a year. He reports that he does not use drugs.   Allergies  Allergen Reactions  . Other     Tide Soap   Medications Prior to Admission  Medication Sig Dispense Refill  . glipiZIDE (GLUCOTROL XL) 2.5 MG 24 hr tablet Take 2.5 mg by mouth daily with breakfast.    . ibuprofen (ADVIL,MOTRIN) 800 MG tablet Take 1 tablet (800 mg total) by mouth every 8 (eight) hours as needed. 30 tablet 0  . losartan-hydrochlorothiazide (HYZAAR) 100-25 MG tablet Take 1 tablet by mouth daily.    . metFORMIN (GLUCOPHAGE) 1000 MG tablet Take 1,000 mg by mouth 2 (two) times daily with a meal.    . Multiple Vitamin (MULTIVITAMIN WITH MINERALS) TABS tablet Take 1 tablet by mouth daily.    . pravastatin (PRAVACHOL) 20 MG tablet Take 20 mg by mouth daily.    . sildenafil (VIAGRA) 100 MG tablet Take 100 mg by mouth daily as needed for erectile dysfunction.      Home: Home Living Family/patient expects to be discharged to:: Unsure Living Arrangements: Alone Additional Comments: Pt lives in a hotel in [place], [place] to be near work. Pt has no permanent residence and has no family/friends in the immediate area to assist him. Pt's daughter lives in [place] and pt has a work friend in [place], [place] who would be willing to let him stay at her house, but she has many stairs there and works a lot. Pt does not  have a car.  Lives With: Alone   Functional History: Prior Function Level of Independence: Independent Comments: Works in [employer]. Does not have a car now  Functional Status:  Mobility: Bed Mobility Overal bed mobility: Needs Assistance Bed Mobility: Rolling, Sidelying to Sit Rolling: Min guard Sidelying to sit: Min assist General bed mobility comments: cues for sequence and assist to elevate trunk Transfers Overall transfer level: Needs assistance Equipment used:  None Transfers: Sit to/from Stand Sit to Stand: Supervision General transfer comment: cues for hand placement and sequence  Ambulation/Gait Ambulation/Gait assistance: Min guard Ambulation Distance (Feet): 210 Feet Assistive device: Rolling walker (2 wheeled) Gait Pattern/deviations: Step-through pattern, Decreased stride length, Narrow base of support General Gait Details: cues for posture in RW, very slow guarded and painful gait requiring 12 min to complete limited gait distance. Pt stopping periodically to elevate one foot then the other to decrease pressure and pain Gait velocity interpretation: Below normal speed for age/gender    ADL: ADL Overall ADL's : Needs assistance/impaired Grooming: Wash/dry hands, Minimal assistance, Standing Upper Body Bathing: Minimal assitance, Sitting Lower Body Bathing: Maximal assistance, Sit to/from stand Upper Body Dressing : Minimal assistance, Sitting Upper Body Dressing Details (indicate cue type and reason): to don back brace Lower Body Dressing: Maximal assistance, Sit to/from stand Toilet Transfer: Minimal assistance, Cueing for safety, Ambulation, BSC Toileting- Clothing Manipulation and Hygiene: Minimal assistance, Sit to/from stand Functional mobility during ADLs: Minimal assistance  Cognition: Cognition Overall Cognitive Status: Within Functional Limits for tasks assessed Orientation Level: Oriented X4 Cognition Arousal/Alertness: Awake/alert Behavior During Therapy: WFL for tasks assessed/performed Overall Cognitive Status: Within Functional Limits for tasks assessed  Physical Exam: Blood pressure (!) 144/102, pulse 83, temperature 98.9 F (37.2 C), temperature source Oral, resp. rate 20, weight 85.3 kg (188 lb), SpO2 97 %. Physical Exam  Vitals reviewed. Constitutional: He is oriented to person, place, and time. He appears well-developed and well-nourished.  Appears slightly confused and slowed  HENT:  Head: Normocephalic  and atraumatic.  Eyes: Conjunctivae and EOM are normal. Pupils are equal, round, and reactive to light. No scleral icterus.  Neck: Normal range of motion. Neck supple.  Cardiovascular: Normal rate and regular rhythm.   Respiratory: Effort normal and breath sounds normal.  GI: Soft. Bowel sounds are normal.  Neurological: He is alert and oriented to person, place, and time. Coordination abnormal.  Speech clear.  Pain with minnimal movements Motor: 4/5 throughout (pain inhibition) Sensation intact to light touch   Skin: Skin is warm and dry.  Back honeycomb dressing c/d/i.  Psychiatric:  His speech is delayed. He is slowed.     Results for orders placed or performed during the hospital encounter of 06/03/16 (from the past 48 hour(s))  Hemoglobin A1c     Status: Abnormal   Collection Time: 06/03/16  3:02 PM  Result Value Ref Range   Hgb A1c MFr Bld 8.1 (H) 4.8 - 5.6 %    Comment: (NOTE)         Pre-diabetes: 5.7 - 6.4         Diabetes: >6.4         Glycemic control for adults with diabetes: <7.0    Mean Plasma Glucose 186 mg/dL    Comment: (NOTE) Performed At: [facility] [address] [place], [place] [id number] Mila HomerHancock William F MD WU:[id number]:[phone]   Glucose, capillary     Status: Abnormal   Collection Time: 06/03/16  3:29 PM  Result Value Ref Range  Glucose-Capillary 157 (H) 65 - 99 mg/dL   Comment 1 Notify RN    Comment 2 Document in Chart   Glucose, capillary     Status: Abnormal   Collection Time: 06/03/16  5:57 PM  Result Value Ref Range   Glucose-Capillary 165 (H) 65 - 99 mg/dL   Comment 1 Document in Chart   Glucose, capillary     Status: Abnormal   Collection Time: 06/03/16  9:35 PM  Result Value Ref Range   Glucose-Capillary 167 (H) 65 - 99 mg/dL   Comment 1 Notify RN    Comment 2 Document in Chart   Glucose, capillary     Status: Abnormal   Collection Time: 06/04/16  8:18 AM  Result Value Ref Range   Glucose-Capillary 150 (H) 65 - 99 mg/dL   Glucose, capillary     Status: Abnormal   Collection Time: 06/04/16 11:59 AM  Result Value Ref Range   Glucose-Capillary 172 (H) 65 - 99 mg/dL  Glucose, capillary     Status: Abnormal   Collection Time: 06/04/16  5:28 PM  Result Value Ref Range   Glucose-Capillary 142 (H) 65 - 99 mg/dL  Glucose, capillary     Status: Abnormal   Collection Time: 06/04/16  9:29 PM  Result Value Ref Range   Glucose-Capillary 140 (H) 65 - 99 mg/dL   Comment 1 Notify RN    Comment 2 Document in Chart   Glucose, capillary     Status: Abnormal   Collection Time: 06/05/16  8:26 AM  Result Value Ref Range   Glucose-Capillary 152 (H) 65 - 99 mg/dL  Glucose, capillary     Status: Abnormal   Collection Time: 06/05/16 12:35 PM  Result Value Ref Range   Glucose-Capillary 142 (H) 65 - 99 mg/dL   Dg Lumbar Spine 2-3 Views  Result Date: 06/04/2016 CLINICAL DATA:  Postoperative pain.  Lumbar fusion EXAM: LUMBAR SPINE - 2-3 VIEW COMPARISON:  Yesterday FINDINGS: Recent L4-5 discectomy with stable unremarkable positioning of intervertebral cage. Posterior fusion hardware is intact. Slight residual anterolisthesis is stable. No evidence of osseous fracture. Advanced L1-2 disc degeneration. IMPRESSION: No acute finding. Stable hardware positioning compared to placement fluoroscopy. Electronically Signed   By: Marnee Spring M.D.   On: 06/04/2016 07:35       Medical Problem List and Plan: 1.  Gait abnormality secondary to RLE radiculopathy. 2.  DVT Prophylaxis/Anticoagulation: Pharmaceutical: Lovenox 3. Pain Management:  Managed on oxycodone prn. Will add low dose gabapentin to help manage neuropathy.   4. Mood: LCSW to follow for evaluation and support.  5. Neuropsych: This patient is capable of making decisions on his own behalf. 6. Skin/Wound Care: Monitor wound daily for healing.  7. Fluids/Electrolytes/Nutrition: Monitor I/O. Check lytes in am.  8. T2DM: Monitor BS ac/hs. Continue glucotrol Xl daily and  metformin bid.  9. HTN: Monitor BP bid. Continue cozaar, HCTZ.  10. Constipation: On amitiza bid.  Will check KUB if no results by admission.   Post Admission Physician Evaluation: 1. Functional deficits secondary  to RLE radiculopathy. 2. Patient is admitted to receive collaborative, interdisciplinary care between the physiatrist, rehab nursing staff, and therapy team. 3. Patient's level of medical complexity and substantial therapy needs in context of that medical necessity cannot be provided at a lesser intensity of care such as a SNF. 4. Patient has experienced substantial functional loss from his/her baseline which was documented above under the "Functional History" and "Functional Status" headings.  Judging by the patient's  diagnosis, physical exam, and functional history, the patient has potential for functional progress which will result in measurable gains while on inpatient rehab.  These gains will be of substantial and practical use upon discharge  in facilitating mobility and self-care at the household level. 5. Physiatrist will provide 24 hour management of medical needs as well as oversight of the therapy plan/treatment and provide guidance as appropriate regarding the interaction of the two. 6. 24 hour rehab nursing will assist with bladder management, safety, skin/wound care, disease management, pain management and patient education and help integrate therapy concepts, techniques,education, etc. 7. PT will assess and treat for/with: Lower extremity strength, range of motion, stamina, balance, functional mobility, safety, adaptive techniques and equipment, woundcare, coping skills, pain control, education.   Goals are: Mod I. 8. OT will assess and treat for/with: ADL's, functional mobility, safety, upper extremity strength, adaptive techniques and equipment, wound mgt, ego support, and community reintegration.   Goals are: Mod I. Therapy may not proceed with showering this  patient. 9. Case Management and Social Worker will assess and treat for psychological issues and discharge planning. 10. Team conference will be held weekly to assess progress toward goals and to determine barriers to discharge. 11. Patient will receive at least 3 hours of therapy per day at least 5 days per week. 12. ELOS: 5-7 days.       13. Prognosis:  good  Maryla Morrow, MD 06/05/2016

## 2016-06-05 NOTE — Interval H&P Note (Signed)
Jacob Bond was admitted today to Inpatient Rehabilitation with the diagnosis of RLE radiculopathy.  The patient's history has been reviewed, patient examined, and there is no change in status.  Patient continues to be appropriate for intensive inpatient rehabilitation.  I have reviewed the patient's chart and labs.  Questions were answered to the patient's satisfaction. The PAPE has been reviewed and assessment remains appropriate.  Jodie Leiner Karis Jubanil Daphnee Preiss 06/05/2016, 6:22 PM

## 2016-06-05 NOTE — PMR Pre-admission (Signed)
PMR Admission Coordinator Pre-Admission Assessment  Patient: Jacob Bond is an 60 y.o., male MRN: [id number] DOB: 1956-09-14 Height:   Weight: 85.3 kg (188 lb)              Insurance Information HMO:     PPO:       PCP:       IPA:       80/20:       OTHER:   PRIMARY:  Generic workers comp      Policy#: [id number]      Subscriber: Gita Kudo  CM Name: Cira Rue      Phone#: [phone]     Fax#: [phone] Pre-Cert#: [id number]      Employer: [employer] - FT Benefits:  Phone #: [phone]     Name: Veverly Fells. Date: 10-2015     Deduct:        Out of Pocket Max:        Life Max:   CIR:        SNF:   Outpatient:       Co-Pay:   Home Health:        Co-Pay:   DME:       Co-Pay:   Providers: Adjuster is Carmell Austria # [phone], fax [phone]   Emergency Contact Information Contact Information    Name Relation Home Work [place] Daughter [phone]     Crite,Barbara Mother [phone]     Peterson Lombard [phone]       Current Medical History  Patient Admitting Diagnosis:  L4-5 Fusion  History of Present Illness: A 60 y.o. 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 Failed conservative management. He was admitted on 06/03/16 for transforaminal L4-L5 fusion by Dr. Gayland Curry on 06/03/16. Therapy evaluations completed and patient requiring assistance with ADL tasks as well as mobility. CIR recommended for follow up therapy. He has associated weakness.   Patient lives in hotel in [place] and does not have a car. No local family or friends available to assist after discharge.    Past Medical History  Past Medical History:  Diagnosis Date  . Asthma    child  . Back pain   . Diabetes mellitus without complication (HCC)   . Hypertension     Family History  family history is not on file.  Prior Rehab/Hospitalizations: Had outpatient therapy in June 2017 for 3-4  weeks due to back injury.  Has the patient had major surgery during 100 days prior to admission? No  Current Medications   Current Facility-Administered Medications:  .  0.9 %  sodium chloride infusion, 250 mL, Intravenous, Continuous, Venita Lick, MD .  glipiZIDE (GLUCOTROL XL) 24 hr tablet 2.5 mg, 2.5 mg, Oral, Q breakfast, Venita Lick, MD, 2.5 mg at 06/05/16 0915 .  losartan (COZAAR) tablet 100 mg, 100 mg, Oral, Daily, 100 mg at 06/05/16 0913 **AND** hydrochlorothiazide (HYDRODIURIL) tablet 25 mg, 25 mg, Oral, Daily, Bertram Millard, RPH, 25 mg at 06/05/16 0913 .  insulin aspart (novoLOG) injection 0-15 Units, 0-15 Units, Subcutaneous, TID WC, Venita Lick, MD, 3 Units at 06/05/16 0915 .  lactated ringers infusion, , Intravenous, Continuous, Venita Lick, MD .  lubiprostone (AMITIZA) capsule 24 mcg, 24 mcg, Oral, BID WC, Venita Lick, MD, 24 mcg at 06/05/16 0914 .  magnesium citrate solution 1 Bottle, 1 Bottle, Oral, Once, Venita Lick, MD .  menthol-cetylpyridinium (CEPACOL) lozenge 3 mg, 1 lozenge, Oral, PRN **OR**  phenol (CHLORASEPTIC) mouth spray 1 spray, 1 spray, Mouth/Throat, PRN, Venita Lick, MD .  metFORMIN (GLUCOPHAGE) tablet 1,000 mg, 1,000 mg, Oral, BID WC, Venita Lick, MD, 1,000 mg at 06/05/16 0913 .  methocarbamol (ROBAXIN) tablet 500 mg, 500 mg, Oral, Q6H PRN, 500 mg at 06/05/16 0913 **OR** methocarbamol (ROBAXIN) 500 mg in dextrose 5 % 50 mL IVPB, 500 mg, Intravenous, Q6H PRN, Venita Lick, MD .  morphine 2 MG/ML injection 1-4 mg, 1-4 mg, Intravenous, Q3H PRN, Venita Lick, MD, 4 mg at 06/03/16 1519 .  ondansetron (ZOFRAN) injection 4 mg, 4 mg, Intravenous, Q4H PRN, Venita Lick, MD .  oxyCODONE (Oxy IR/ROXICODONE) immediate release tablet 10 mg, 10 mg, Oral, Q4H PRN, Venita Lick, MD, 10 mg at 06/05/16 0914 .  pravastatin (PRAVACHOL) tablet 20 mg, 20 mg, Oral, Daily, Venita Lick, MD, 20 mg at 06/05/16 0914 .  sodium chloride flush (NS) 0.9 % injection 3 mL,  3 mL, Intravenous, Q12H, Venita Lick, MD, 3 mL at 06/03/16 2200 .  sodium chloride flush (NS) 0.9 % injection 3 mL, 3 mL, Intravenous, PRN, Venita Lick, MD  Patients Current Diet: Diet Carb Modified Fluid consistency: Thin; Room service appropriate? 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  On light duty and last worked 05/22/16  Home Assistive Devices / Equipment    Prior Device Use: Indicate devices/aids used by the patient prior to current illness, exacerbation or injury? None  Prior Functional Level Prior Function Level of Independence: Independent Comments: Works in Science writer. Does not have a car now  Self Care: Did the patient need help bathing, dressing, using the toilet or eating?  Independent  Indoor Mobility: Did the patient need assistance with walking from room to room (with or without device)? Independent  Stairs: Did the patient need assistance with internal or external stairs (with or without device)? Independent  Functional Cognition: Did the patient need help planning regular tasks such as shopping or remembering to take medications? Independent  Current Functional Level Cognition  Overall Cognitive Status: Within Functional Limits for tasks assessed Orientation Level: Oriented X4    Extremity Assessment (includes Sensation/Coordination)  Upper Extremity Assessment: Defer to OT evaluation  Lower Extremity Assessment: Generalized weakness, Overall WFL for tasks assessed    ADLs  Overall ADL's : Needs  assistance/impaired Grooming: Wash/dry hands, Minimal assistance, Standing Upper Body Bathing: Minimal assitance, Sitting Lower Body Bathing: Maximal assistance, Sit to/from stand Upper Body Dressing : Minimal assistance, Sitting Upper Body Dressing Details (indicate cue type and reason): to don back brace Lower Body Dressing: Maximal assistance, Sit to/from stand Toilet Transfer: Minimal assistance, Cueing for safety, Ambulation, BSC Toileting- Clothing Manipulation and Hygiene: Minimal assistance, Sit to/from stand Functional mobility during ADLs: Minimal assistance    Mobility  Overal bed mobility: Needs Assistance Bed Mobility: Rolling, Sidelying to Sit Rolling: Min guard Sidelying to sit: Min assist General bed mobility comments: cues for sequence and assist to elevate trunk    Transfers  Overall transfer level: Needs assistance Equipment used: None Transfers: Sit to/from Stand Sit to Stand: Supervision General transfer comment: cues  for hand placement and sequence     Ambulation / Gait / Stairs / Wheelchair Mobility  Ambulation/Gait Ambulation/Gait assistance: Min guard Ambulation Distance (Feet): 210 Feet Assistive device: Rolling walker (2 wheeled) Gait Pattern/deviations: Step-through pattern, Decreased stride length, Narrow base of support General Gait Details: cues for posture in RW, very slow guarded and painful gait requiring 12 min to complete limited gait distance. Pt stopping periodically to elevate one foot then the other to decrease pressure and pain Gait velocity interpretation: Below normal speed for age/gender    Posture / Balance Balance Overall balance assessment: Needs assistance Sitting-balance support: No upper extremity supported, Feet supported Sitting balance-Leahy Scale: Good Standing balance support: No upper extremity supported, During functional activity Standing balance-Leahy Scale: Fair    Special needs/care consideration BiPAP/CPAP No CPM  No Continuous Drip IV No Dialysis No      Life Vest No Oxygen No Special Bed No Trach Size No Wound Vac (area) No     Skin Has surgical incision lumbar back                             Bowel mgmt: Patient reports only small BM since 06/03/16 Bladder mgmt: Voiding in bathroom with assistance Diabetic mgmt Yes, on oral medications at home.  On insulin in the hospital.    Previous Home Environment Living Arrangements: Alone  Lives With: Alone Additional Comments: Pt lives in a hotel in [place], [place] to be near work. Pt has no permanent residence and has no family/friends in the immediate area to assist him. Pt's daughter lives in [place] and pt has a work friend in [place], [place] who would be willing to let him stay at her house, but she has many stairs there and works a lot. Pt does not have a car.  Discharge Living Setting Plans for Discharge Living Setting: Other (Comment) (May go to hotel.  May go to daughters townhouse in [place].) Type of Home at Discharge: Other (Comment) (Workers comp may assist with post rehab housing.)  Social/Family/Support Systems Patient Roles: Parent (Separated from wife, has a daughter in [place].) Contact Information: Thayer DallasBarbara Crite - mom - [phone] Anticipated Caregiver: self, may return to hotel or may go home with daughter in [place] Anticipated Caregiver's Contact Information: Marlowe ShoresMetrice Calvin - daughter [phone] Ability/Limitations of Caregiver: Dtr in [place] works.  Mom [place] in [place] in [place]. Caregiver Availability: Other (Comment) (Not sure of any caregiver support at this time.) Discharge Plan Discussed with Primary Caregiver: Yes Is Caregiver In Agreement with Plan?: Yes Does Caregiver/Family have Issues with Lodging/Transportation while Pt is in Rehab?: Yes (All family are out of town.)  Goals/Additional Needs Patient/Family Goal for Rehab: PT/OT mod I goals Expected length of stay: 5-7 days Cultural Considerations: Holiness Dietary  Needs: Carb mod, med calorie, thin liquids Equipment Needs: TBD Pt/Family Agrees to Admission and willing to participate: Yes Program Orientation Provided & Reviewed with Pt/Caregiver Including Roles  & Responsibilities: Yes  Decrease burden of Care through IP rehab admission: N/A  Possible need for SNF placement upon discharge: Not anticipated  Patient Condition: This patient's condition remains as documented in the consult dated 06/04/16, in which the Rehabilitation Physician determined and documented that the patient's condition is appropriate for intensive rehabilitative care in an inpatient rehabilitation facility. Will admit to inpatient rehab today.   Preadmission Screen Completed By:  Trish MageLogue, Elandra Powell M, 06/05/2016 12:32 PM ______________________________________________________________________   Discussed status with Dr. Allena KatzPatel on 06/05/16  at 1231 and received telephone approval for admission today.  Admission Coordinator:  Trish Mage, time1231/Date08/11/17

## 2016-06-05 NOTE — Progress Notes (Signed)
Pt D/C'd to inpatient rehab room 4W01 per MD order. Pt is stable @ D/C and report given to nurse. Rema FendtAshley Kayleb Warshaw, RN

## 2016-06-05 NOTE — Progress Notes (Signed)
Physical Therapy Treatment Patient Details Name: Jacob Bond MRN: [id number] DOB: 1956/09/22 Today's Date: 06/05/2016    History of Present Illness 60 y.o. male s/p TLIF L4-5. PMH significant for HTN and DM type II.    PT Comments    Pt continues to be pleasant and eager to progress mobility. Pt still with great difficulty with gait and functional activity although making gains with transfers. Continued education for precautions and mobility. Will continue to follow.   Follow Up Recommendations  CIR     Equipment Recommendations       Recommendations for Other Services       Precautions / Restrictions Precautions Precautions: Back;Fall Precaution Comments: Educated pt on all back precautions with handout reviewed, pt able to recall 2/3  Required Braces or Orthoses: Spinal Brace Spinal Brace: Lumbar corset;Applied in sitting position    Mobility  Bed Mobility                  Transfers Overall transfer level: Needs assistance     Sit to Stand: Supervision         General transfer comment: cues for hand placement and sequence   Ambulation/Gait Ambulation/Gait assistance: Min guard Ambulation Distance (Feet): 210 Feet Assistive device: Rolling walker (2 wheeled) Gait Pattern/deviations: Step-through pattern;Decreased stride length;Narrow base of support   Gait velocity interpretation: Below normal speed for age/gender General Gait Details: cues for posture in RW, very slow guarded and painful gait requiring 12 min to complete limited gait distance. Pt stopping periodically to elevate one foot then the other to decrease pressure and pain   Stairs            Wheelchair Mobility    Modified Rankin (Stroke Patients Only)       Balance                                    Cognition Arousal/Alertness: Awake/alert Behavior During Therapy: WFL for tasks assessed/performed Overall Cognitive Status: Within Functional Limits for tasks  assessed                      Exercises      General Comments        Pertinent Vitals/Pain Pain Score: 9  Pain Location: back and bil thigh Pain Descriptors / Indicators: Tingling Pain Intervention(s): Limited activity within patient's tolerance;Premedicated before session;Repositioned    Home Living                      Prior Function            PT Goals (current goals can now be found in the care plan section) Progress towards PT goals: Progressing toward goals    Frequency       PT Plan Current plan remains appropriate    Co-evaluation             End of Session Equipment Utilized During Treatment: Back brace Activity Tolerance: Patient limited by pain Patient left: in chair;with call bell/phone within reach     Time: [id number] PT Time Calculation (min) (ACUTE ONLY): 21 min  Charges:  $Gait Training: 8-22 mins                    G Codes:      Jacob Bond, Jacob Bond 06/05/2016, 9:23 AM Jacob Bond, PT [phone]

## 2016-06-05 NOTE — H&P (View-Only) (Signed)
Physical Medicine and Rehabilitation Admission H&P    CC: Back pain with radiculopathy RLE  HPI:  Jacob Bond is a 60 y.o. 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 Failed conservative management. He was admitted on 06/03/16 for transforaminal L4-L5 fusion by Dr. Gayland Curry on 06/03/16. Therapy evaluations completed and patient requiring assistance with ADL tasks as well as mobility.  Post op has had neuropathy BLE as well as pain affecting mobility. CIR recommended for follow up therapy.  Review of Systems  HENT: Negative for hearing loss.   Eyes: Negative for blurred vision and double vision.  Respiratory: Positive for cough. Negative for shortness of breath.   Cardiovascular: Negative for chest pain and palpitations.  Gastrointestinal: Positive for abdominal pain and constipation. Negative for nausea.  Genitourinary: Negative for dysuria and urgency.       Hesitancy. Gets up 2-3 times at night   Musculoskeletal: Positive for back pain.  Skin: Negative for itching and rash.  Neurological: Positive for dizziness, tingling, sensory change, focal weakness and weakness. Negative for headaches.  All other systems reviewed and are negative.   Past Medical History:  Diagnosis Date  . Asthma    child  . Back pain   . Diabetes mellitus without complication (HCC)   . Hypertension     Past Surgical History:  Procedure Laterality Date  . KNEE ARTHROSCOPY Right   . TRANSFORAMINAL LUMBAR INTERBODY FUSION (TLIF) WITH PEDICLE SCREW FIXATION 1 LEVEL N/A 06/03/2016   Procedure: TRANSFORAMINAL LUMBAR INTERBODY FUSION (TLIF) WITH PEDICLE SCREW FIXATION 1 LEVEL;  Surgeon: Venita Lick, MD;  Location: [facility] OR;  Service: Orthopedics;  Laterality: N/A;    History reviewed. No pertinent family history.    Social History:  Lives alone--from [facility]. Independent without AD and was working till a  week ago. He  reports that he has never smoked. He has never used smokeless tobacco. He reports that he drinks alcohol 2-3 times a year. He reports that he does not use drugs.   Allergies  Allergen Reactions  . Other     Tide Soap   Medications Prior to Admission  Medication Sig Dispense Refill  . glipiZIDE (GLUCOTROL XL) 2.5 MG 24 hr tablet Take 2.5 mg by mouth daily with breakfast.    . ibuprofen (ADVIL,MOTRIN) 800 MG tablet Take 1 tablet (800 mg total) by mouth every 8 (eight) hours as needed. 30 tablet 0  . losartan-hydrochlorothiazide (HYZAAR) 100-25 MG tablet Take 1 tablet by mouth daily.    . metFORMIN (GLUCOPHAGE) 1000 MG tablet Take 1,000 mg by mouth 2 (two) times daily with a meal.    . Multiple Vitamin (MULTIVITAMIN WITH MINERALS) TABS tablet Take 1 tablet by mouth daily.    . pravastatin (PRAVACHOL) 20 MG tablet Take 20 mg by mouth daily.    . sildenafil (VIAGRA) 100 MG tablet Take 100 mg by mouth daily as needed for erectile dysfunction.      Home: Home Living Family/patient expects to be discharged to:: Unsure Living Arrangements: Alone Additional Comments: Pt lives in a hotel in [place], [place] to be near work. Pt has no permanent residence and has no family/friends in the immediate area to assist him. Pt's daughter lives in [place] and pt has a work friend in [place], [place] who would be willing to let him stay at her house, but she has many stairs there and works a lot. Pt does not  have a car.  Lives With: Alone   Functional History: Prior Function Level of Independence: Independent Comments: Works in Science writerroad crew maintenance. Does not have a car now  Functional Status:  Mobility: Bed Mobility Overal bed mobility: Needs Assistance Bed Mobility: Rolling, Sidelying to Sit Rolling: Min guard Sidelying to sit: Min assist General bed mobility comments: cues for sequence and assist to elevate trunk Transfers Overall transfer level: Needs assistance Equipment used:  None Transfers: Sit to/from Stand Sit to Stand: Supervision General transfer comment: cues for hand placement and sequence  Ambulation/Gait Ambulation/Gait assistance: Min guard Ambulation Distance (Feet): 210 Feet Assistive device: Rolling walker (2 wheeled) Gait Pattern/deviations: Step-through pattern, Decreased stride length, Narrow base of support General Gait Details: cues for posture in RW, very slow guarded and painful gait requiring 12 min to complete limited gait distance. Pt stopping periodically to elevate one foot then the other to decrease pressure and pain Gait velocity interpretation: Below normal speed for age/gender    ADL: ADL Overall ADL's : Needs assistance/impaired Grooming: Wash/dry hands, Minimal assistance, Standing Upper Body Bathing: Minimal assitance, Sitting Lower Body Bathing: Maximal assistance, Sit to/from stand Upper Body Dressing : Minimal assistance, Sitting Upper Body Dressing Details (indicate cue type and reason): to don back brace Lower Body Dressing: Maximal assistance, Sit to/from stand Toilet Transfer: Minimal assistance, Cueing for safety, Ambulation, BSC Toileting- Clothing Manipulation and Hygiene: Minimal assistance, Sit to/from stand Functional mobility during ADLs: Minimal assistance  Cognition: Cognition Overall Cognitive Status: Within Functional Limits for tasks assessed Orientation Level: Oriented X4 Cognition Arousal/Alertness: Awake/alert Behavior During Therapy: WFL for tasks assessed/performed Overall Cognitive Status: Within Functional Limits for tasks assessed  Physical Exam: Blood pressure (!) 144/102, pulse 83, temperature 98.9 F (37.2 C), temperature source Oral, resp. rate 20, weight 85.3 kg (188 lb), SpO2 97 %. Physical Exam  Vitals reviewed. Constitutional: He is oriented to person, place, and time. He appears well-developed and well-nourished.  Appears slightly confused and slowed  HENT:  Head: Normocephalic  and atraumatic.  Eyes: Conjunctivae and EOM are normal. Pupils are equal, round, and reactive to light. No scleral icterus.  Neck: Normal range of motion. Neck supple.  Cardiovascular: Normal rate and regular rhythm.   Respiratory: Effort normal and breath sounds normal.  GI: Soft. Bowel sounds are normal.  Neurological: He is alert and oriented to person, place, and time. Coordination abnormal.  Speech clear.  Pain with minnimal movements Motor: 4/5 throughout (pain inhibition) Sensation intact to light touch   Skin: Skin is warm and dry.  Back honeycomb dressing c/d/i.  Psychiatric:  His speech is delayed. He is slowed.     Results for orders placed or performed during the hospital encounter of 06/03/16 (from the past 48 hour(s))  Hemoglobin A1c     Status: Abnormal   Collection Time: 06/03/16  3:02 PM  Result Value Ref Range   Hgb A1c MFr Bld 8.1 (H) 4.8 - 5.6 %    Comment: (NOTE)         Pre-diabetes: 5.7 - 6.4         Diabetes: >6.4         Glycemic control for adults with diabetes: <7.0    Mean Plasma Glucose 186 mg/dL    Comment: (NOTE) Performed At: [facility] [address] [place], [place] [id number] Mila HomerHancock William F MD WU:[id number]:[phone]   Glucose, capillary     Status: Abnormal   Collection Time: 06/03/16  3:29 PM  Result Value Ref Range  Glucose-Capillary 157 (H) 65 - 99 mg/dL   Comment 1 Notify RN    Comment 2 Document in Chart   Glucose, capillary     Status: Abnormal   Collection Time: 06/03/16  5:57 PM  Result Value Ref Range   Glucose-Capillary 165 (H) 65 - 99 mg/dL   Comment 1 Document in Chart   Glucose, capillary     Status: Abnormal   Collection Time: 06/03/16  9:35 PM  Result Value Ref Range   Glucose-Capillary 167 (H) 65 - 99 mg/dL   Comment 1 Notify RN    Comment 2 Document in Chart   Glucose, capillary     Status: Abnormal   Collection Time: 06/04/16  8:18 AM  Result Value Ref Range   Glucose-Capillary 150 (H) 65 - 99 mg/dL   Glucose, capillary     Status: Abnormal   Collection Time: 06/04/16 11:59 AM  Result Value Ref Range   Glucose-Capillary 172 (H) 65 - 99 mg/dL  Glucose, capillary     Status: Abnormal   Collection Time: 06/04/16  5:28 PM  Result Value Ref Range   Glucose-Capillary 142 (H) 65 - 99 mg/dL  Glucose, capillary     Status: Abnormal   Collection Time: 06/04/16  9:29 PM  Result Value Ref Range   Glucose-Capillary 140 (H) 65 - 99 mg/dL   Comment 1 Notify RN    Comment 2 Document in Chart   Glucose, capillary     Status: Abnormal   Collection Time: 06/05/16  8:26 AM  Result Value Ref Range   Glucose-Capillary 152 (H) 65 - 99 mg/dL  Glucose, capillary     Status: Abnormal   Collection Time: 06/05/16 12:35 PM  Result Value Ref Range   Glucose-Capillary 142 (H) 65 - 99 mg/dL   Dg Lumbar Spine 2-3 Views  Result Date: 06/04/2016 CLINICAL DATA:  Postoperative pain.  Lumbar fusion EXAM: LUMBAR SPINE - 2-3 VIEW COMPARISON:  Yesterday FINDINGS: Recent L4-5 discectomy with stable unremarkable positioning of intervertebral cage. Posterior fusion hardware is intact. Slight residual anterolisthesis is stable. No evidence of osseous fracture. Advanced L1-2 disc degeneration. IMPRESSION: No acute finding. Stable hardware positioning compared to placement fluoroscopy. Electronically Signed   By: Marnee Spring M.D.   On: 06/04/2016 07:35       Medical Problem List and Plan: 1.  Gait abnormality secondary to RLE radiculopathy. 2.  DVT Prophylaxis/Anticoagulation: Pharmaceutical: Lovenox 3. Pain Management:  Managed on oxycodone prn. Will add low dose gabapentin to help manage neuropathy.   4. Mood: LCSW to follow for evaluation and support.  5. Neuropsych: This patient is capable of making decisions on his own behalf. 6. Skin/Wound Care: Monitor wound daily for healing.  7. Fluids/Electrolytes/Nutrition: Monitor I/O. Check lytes in am.  8. T2DM: Monitor BS ac/hs. Continue glucotrol Xl daily and  metformin bid.  9. HTN: Monitor BP bid. Continue cozaar, HCTZ.  10. Constipation: On amitiza bid.  Will check KUB if no results by admission.   Post Admission Physician Evaluation: 1. Functional deficits secondary  to RLE radiculopathy. 2. Patient is admitted to receive collaborative, interdisciplinary care between the physiatrist, rehab nursing staff, and therapy team. 3. Patient's level of medical complexity and substantial therapy needs in context of that medical necessity cannot be provided at a lesser intensity of care such as a SNF. 4. Patient has experienced substantial functional loss from his/her baseline which was documented above under the "Functional History" and "Functional Status" headings.  Judging by the patient's  diagnosis, physical exam, and functional history, the patient has potential for functional progress which will result in measurable gains while on inpatient rehab.  These gains will be of substantial and practical use upon discharge  in facilitating mobility and self-care at the household level. 5. Physiatrist will provide 24 hour management of medical needs as well as oversight of the therapy plan/treatment and provide guidance as appropriate regarding the interaction of the two. 6. 24 hour rehab nursing will assist with bladder management, safety, skin/wound care, disease management, pain management and patient education and help integrate therapy concepts, techniques,education, etc. 7. PT will assess and treat for/with: Lower extremity strength, range of motion, stamina, balance, functional mobility, safety, adaptive techniques and equipment, woundcare, coping skills, pain control, education.   Goals are: Mod I. 8. OT will assess and treat for/with: ADL's, functional mobility, safety, upper extremity strength, adaptive techniques and equipment, wound mgt, ego support, and community reintegration.   Goals are: Mod I. Therapy may not proceed with showering this  patient. 9. Case Management and Social Worker will assess and treat for psychological issues and discharge planning. 10. Team conference will be held weekly to assess progress toward goals and to determine barriers to discharge. 11. Patient will receive at least 3 hours of therapy per day at least 5 days per week. 12. ELOS: 5-7 days.       13. Prognosis:  good  Maryla Morrow, MD 06/05/2016

## 2016-06-05 NOTE — Progress Notes (Signed)
Received pt. As a transfer from [facility].Pt. Was oriented to the unit routine and protocol,safety plan was explained,fall prevention plan was explained and signed by the pt. And the RN.Welcome video was presented to the pt.

## 2016-06-06 ENCOUNTER — Inpatient Hospital Stay (HOSPITAL_COMMUNITY): Payer: Self-pay

## 2016-06-06 ENCOUNTER — Inpatient Hospital Stay (HOSPITAL_COMMUNITY): Payer: Self-pay | Admitting: *Deleted

## 2016-06-06 LAB — CBC WITH DIFFERENTIAL/PLATELET
BASOS PCT: 0 %
Basophils Absolute: 0 10*3/uL (ref 0.0–0.1)
Eosinophils Absolute: 0.1 10*3/uL (ref 0.0–0.7)
Eosinophils Relative: 1 %
HEMATOCRIT: 33.7 % — AB (ref 39.0–52.0)
HEMOGLOBIN: 11.3 g/dL — AB (ref 13.0–17.0)
LYMPHS PCT: 17 %
Lymphs Abs: 1.9 10*3/uL (ref 0.7–4.0)
MCH: 32.2 pg (ref 26.0–34.0)
MCHC: 33.5 g/dL (ref 30.0–36.0)
MCV: 96 fL (ref 78.0–100.0)
Monocytes Absolute: 1.3 10*3/uL — ABNORMAL HIGH (ref 0.1–1.0)
Monocytes Relative: 12 %
NEUTROS ABS: 7.4 10*3/uL (ref 1.7–7.7)
NEUTROS PCT: 70 %
Platelets: 260 10*3/uL (ref 150–400)
RBC: 3.51 MIL/uL — ABNORMAL LOW (ref 4.22–5.81)
RDW: 12.1 % (ref 11.5–15.5)
WBC: 10.7 10*3/uL — ABNORMAL HIGH (ref 4.0–10.5)

## 2016-06-06 LAB — COMPREHENSIVE METABOLIC PANEL
ALK PHOS: 45 U/L (ref 38–126)
ALT: 14 U/L — ABNORMAL LOW (ref 17–63)
ANION GAP: 9 (ref 5–15)
AST: 21 U/L (ref 15–41)
Albumin: 3 g/dL — ABNORMAL LOW (ref 3.5–5.0)
BILIRUBIN TOTAL: 1 mg/dL (ref 0.3–1.2)
BUN: 9 mg/dL (ref 6–20)
CALCIUM: 8.4 mg/dL — AB (ref 8.9–10.3)
CO2: 30 mmol/L (ref 22–32)
Chloride: 97 mmol/L — ABNORMAL LOW (ref 101–111)
Creatinine, Ser: 1.11 mg/dL (ref 0.61–1.24)
Glucose, Bld: 154 mg/dL — ABNORMAL HIGH (ref 65–99)
POTASSIUM: 3.4 mmol/L — AB (ref 3.5–5.1)
Sodium: 136 mmol/L (ref 135–145)
TOTAL PROTEIN: 6.4 g/dL — AB (ref 6.5–8.1)

## 2016-06-06 LAB — URINALYSIS, ROUTINE W REFLEX MICROSCOPIC
Bilirubin Urine: NEGATIVE
GLUCOSE, UA: NEGATIVE mg/dL
Hgb urine dipstick: NEGATIVE
KETONES UR: 15 mg/dL — AB
LEUKOCYTES UA: NEGATIVE
Nitrite: NEGATIVE
Protein, ur: NEGATIVE mg/dL
Specific Gravity, Urine: 1.018 (ref 1.005–1.030)
pH: 8 (ref 5.0–8.0)

## 2016-06-06 LAB — GLUCOSE, CAPILLARY
GLUCOSE-CAPILLARY: 94 mg/dL (ref 65–99)
GLUCOSE-CAPILLARY: 134 mg/dL — AB (ref 65–99)
GLUCOSE-CAPILLARY: 76 mg/dL (ref 65–99)
Glucose-Capillary: 146 mg/dL — ABNORMAL HIGH (ref 65–99)

## 2016-06-06 NOTE — Evaluation (Signed)
Physical Therapy Assessment and Plan  Patient Details  Name: Jacob Bond MRN: [id number] Date of Birth: [birth year]  PT Diagnosis: Abnormality of gait, Difficulty walking, Impaired sensation, Low back pain and Muscle weakness Rehab Potential: Good ELOS: 5-7 days   Today's Date: 06/06/2016 PT Individual Time: 1108-1208 and 13:45-14:30  PT Individual Time Calculation (min): 60 min and 36mn    Problem List: Patient Active Problem List   Diagnosis Date Noted  . Spondylolisthesis of lumbar region 06/05/2016  . S/P lumbar fusion   . Surgery, elective   . Benign essential HTN   . Diabetes mellitus type 2 in nonobese (HCC)   . Chronic back pain 06/03/2016    Past Medical History:  Past Medical History:  Diagnosis Date  . Asthma    child  . Back pain   . [place] without [place] ([place]   . Hypertension    Past Surgical History:  Past Surgical History:  Procedure Laterality Date  . KNEE ARTHROSCOPY Right   . 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 Failed conservative management. He was admitted on 06/03/16 for transforaminal L4-L5 fusion by Dr. BDelton Prairieon 06/03/16. Therapy evaluations completed and patient requiring assistance with ADL tasks as well as mobility.  Post op has had neuropathy BLE as well as pain affecting mobility Patient transferred to CIR on 06/05/2016 .    Patient currently requires mod with mobility secondary to muscle weakness and muscle joint tightness,   and decreased standing balance.  Prior to  hospitalization, patient was independent  with mobility and lived with Alone in a Other(Comment) (working intermittently, resides in motels/hotels when workin) home.  Home access is  Level entry.  Patient will benefit from skilled PT intervention to maximize safe functional mobility, minimize fall risk and decrease caregiver burden for planned discharge home with 24 hour supervision.  Anticipate patient will benefit from follow up HCrescentat discharge.  PT - End of Session Activity Tolerance: Tolerates < 10 min activity, no significant change in vital signs Endurance Deficit: Yes Endurance Deficit Description: Limited by pain PT Assessment Rehab Potential (ACUTE/IP ONLY): Good Barriers to Discharge: Decreased caregiver support PT Patient demonstrates impairments in the following area(s): Balance;Endurance;Nutrition;Pain;Safety;Sensory;Skin Integrity PT Transfers Functional Problem(s): Bed Mobility;Bed to Chair;Car;Furniture PT Locomotion Functional Problem(s): Ambulation;Stairs PT Plan PT Intensity: Minimum of 1-2 x/day ,45 to 90 minutes PT Frequency: 5 out of 7 days PT Duration Estimated Length of Stay: 5-7 days PT Treatment/Interventions: Ambulation/gait training;Balance/vestibular training;Community reintegration;Discharge planning;DME/adaptive equipment instruction;Disease management/prevention;Functional mobility training;Neuromuscular re-education;Pain management;Patient/family education;Psychosocial support;Skin care/wound management;Splinting/orthotics;Stair training;Therapeutic Activities;Therapeutic Exercise;UE/LE Strength taining/ROM;Wheelchair propulsion/positioning PT Transfers Anticipated Outcome(s): Supervision PT Locomotion Anticipated Outcome(s): Supervision x150 PT Recommendation Follow Up Recommendations: Home health PT;24 hour supervision/assistance Patient destination: Home Equipment Recommended: Rolling walker with 5" wheels [facility] Details[facility] Pt tx initiated upon eval. Pt oriented to unit, call bell, PT POC, and principles of rehab. Pt was educated on fall risk reduction and precautions, back precautions and brace management. Pt instructed in transfer training for basic stand-pivot transfer, sit<>stands, sitting balance, standing balance training, stair training, and  gait with RW 2x25'. Pt required up to Mod A for lifting during transfers as well as during dynamic standing when BOS narrows. Pt only able to tolerate 1 stair with Min A, but was able to navigate ramp and uneven terrain with RW and up to Mod A. He was very limited by pain today 10/10 nerve pain in thighs and back pain. Pt left in bed with staff present to rest. He is I with brace don/doffing.   Second tx focused on gait training with RW and therex for strengthening and streching. Pt resting in bed, fatigued but agreeable to PT. Pt continues to c/o 10/10 pain in thighs and low back, RN aware and bringing gabapentin at end of tx as scheduled.  Bed mobility performed with S. Sit<>stands with Min A and cues for safety. Stand-pivot transfer with min A .  Gait in controled setting x125' with RW and Min A for steadying. Less pain this tx.  Attempted Nustep with bil LEs only, but too uncomfortable for pt.  Seated LAQ, marching in available ROM, and UE therex with 4# bar (curls, rows, and shoulder presses 2x10.  PT performed passive stretch to bil HSs x55mn each side while pt performed deep breathing for pain relief.  Pt returned to bed to rest before next therapy.    PT Evaluation Precautions/Restrictions Precautions Precautions: Back;Fall Precaution Booklet Issued: Yes (comment) Precaution Comments: Pt aware of all precautions Required Braces or Orthoses: Spinal Brace Spinal Brace: Lumbar corset;Applied in sitting position Restrictions Weight Bearing Restrictions: No General   Vital Signs  Pain Pain Assessment Pain Assessment:  0-10 Pain Score: 5  Pain Type: Acute pain Pain Location: Back Pain Orientation: Posterior;Mid Pain Descriptors / Indicators: Aching Pain Onset: On-going Pain Intervention(s): Medication (See eMAR) Home Living/Prior Functioning Home Living Available Help at Discharge: Available PRN/intermittently Type of Home: Other(Comment) (working intermittently, resides in motels/hotels when workin) Home Access: Level entry Home Layout: One level Additional Comments: [place]. Pt has no permanent residence and has no family/friends in the immediate area to assist him. [place] let [place] stay at her house, but she has many stairs there and works a lot. Pt does not have a car.  Lives With: Alone Prior Function Level of Independence: Independent with basic ADLs;Independent with gait  Able to Take Stairs?: Yes Driving: Yes Vocation: Part time employment Vocation Requirements: Bending, lifting, was on light duty d/t previous injury,  29 July. 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 Vision/Perception  Vision - Assessment Additional Comments: Reports age-related visual decline; plans to seek assessment Perception Perception: Within Functional Limits Comments: WFL  Cognition Overall Cognitive Status: Within Functional Limits for tasks assessed Arousal/Alertness: Awake/alert Orientation Level: Oriented X4 Attention: Divided Divided Attention: Appears intact Memory: Appears intact Awareness: Appears intact Problem Solving: Appears intact Executive Function: Reasoning Reasoning: Appears intact Safety/Judgment: Appears intact Sensation Sensation Light Touch: Impaired Detail Light Touch Impaired Details: Impaired RLE Stereognosis: Appears Intact Hot/Cold: Appears Intact Proprioception: Appears Intact Additional Comments: Pt with bil painful  sensations in quad areas, r Coordination Gross Motor Movements are Fluid and Coordinated: Yes Fine Motor Movements are Fluid and Coordinated: Yes Motor  Motor Motor: Within Functional Limits  Mobility Bed Mobility Bed Mobility: Supine to Sit;Sit to Supine Supine to Sit: 5: Supervision Sit to Supine: 5: Supervision Sit to  Supine - Details (indicate cue type and reason): Pt moves through logrolling safely and efficiently to achieve supine<>sit Transfers Transfers: Yes Stand Pivot Transfers: 3: Mod assist Stand Pivot Transfer Details (indicate cue type and reason): Pt needed up to Mod lifting assist due to pain during transitional movements. He requires safety cues to beign knee ext in stance as he tends to progress too far throught scooting past edge before lifting hips off seat, leading to increased risk of falls.  Locomotion  Ambulation Ambulation: Yes Ambulation/Gait Assistance: 3: Mod assist Ambulation Distance (Feet): 12 Feet Assistive device: 1 person hand held assist Ambulation/Gait Assistance Details: Pt needed steadying assist to perform shuffling gait Stairs / Additional Locomotion Stairs: Yes Stairs Assistance: 4: Min assist Stairs Assistance Details (indicate cue type and reason): Steadying during ascent.  Stair Management Technique: Two rails;Step to pattern;Forwards;Backwards Number of Stairs: 1 Height of Stairs: 6 Ramp: 4: Min assist Curb: 3: Mod Chemical engineer: Yes Wheelchair Assistance: 5: Careers information officer: Both upper extremities Wheelchair Parts Management: Needs assistance Distance: 100  Trunk/Postural Assessment  Cervical Assessment Cervical Assessment: Within Functional Limits Thoracic Assessment Thoracic Assessment: Within Functional Limits Lumbar Assessment Lumbar Assessment: Exceptions to [facility] (L4-5 spondylolesthesis s/p fusion); minimal lumbropelvic joint mobility during functional movements.   Postural Control Postural Control: Within Functional Limits  Balance Balance Balance Assessed: Yes Dynamic Sitting Balance Dynamic Sitting - Balance Support: No upper extremity supported;Feet supported Dynamic Sitting - Level of Assistance: 5: Stand by assistance Static Standing Balance Static Standing - Balance Support: Bilateral upper extremity supported;During functional activity Static Standing - Level of Assistance: 4: Min assist Dynamic Standing Balance Dynamic Standing - Balance Support: Bilateral upper extremity supported;During functional activity Dynamic Standing - Level of Assistance: 4: Min assist Extremity Assessment  RUE Assessment RUE Assessment: Within Functional Limits LUE Assessment LUE Assessment: Within Functional Limits RLE Assessment RLE Assessment: Exceptions to [facility] RLE Strength RLE Overall Strength Comments: UTA strengthe due to limited painful AROM.  SLR limited to 20 degrees in supine due to pain. Pt demonstrates functional strength, likely 4/5, but difficult to assess with MMT Hip flexion noted to be ,3/5 in sitting.  LLE Assessment LLE Assessment: Exceptions to [facility] LLE Strength LLE Overall Strength Comments: UTA strengthe due to limited painful AROM.  SLR limited to 20 degrees in supine due to pain. Pt demonstrates functional strength, likely 4/5, but difficult to assess with MMT. Hip flexion noted <3/5 in sitting.    See Function Navigator for Current Functional Status.   Refer to Care Plan for Long Term Goals  Recommendations for other services: None  Discharge Criteria: Patient will be discharged from PT if patient refuses treatment 3 consecutive times without medical reason, if treatment goals not met, if there is a change in medical status, if patient makes no progress towards goals or if patient is discharged from hospital.  The above assessment, treatment plan, treatment alternatives and goals were discussed and mutually agreed upon: by  patient  Avrian Delfavero, Corinna Lines, PT, DPT  06/06/2016, 12:52 PM

## 2016-06-06 NOTE — Progress Notes (Signed)
Nutrition Brief Note  Patient identified on the Malnutrition Screening Tool (MST) Report.  Wt Readings from Last 15 Encounters:  06/06/16 188 lb (85.3 kg)  06/03/16 188 lb (85.3 kg)  05/26/16 188 lb 12.8 oz (85.6 kg)  05/23/16 195 lb (88.5 kg)    Body mass index is 24.8 kg/m. Patient meets criteria for Normal based on current BMI.   Current diet order is Carbohydrate Modified, patient is consuming approximately 100% of meals at this time.   Labs and medications reviewed.   No nutrition interventions warranted at this time. If nutrition issues arise, please consult RD.   Maureen ChattersKatie Ebert Forrester, RD, LDN Pager #: [phone] After-Hours Pager #: [phone]
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
## 2016-06-06 NOTE — Progress Notes (Signed)
Patient ID: Jacob Bond, male   DOB: 1956/01/13, 60 y.o.   MRN: [id number]  Jacob Bond a 60 y.o.malewith history of DM, HTN, back injury with RLE neuropathy.  He was admitted on 06/03/16 for transforaminal L4-L5 fusion by Dr. Gayland Curry on 06/03/16. CIR recommended for follow up therapy.  Fairly comfortable night with modest back and leg pain  Review of Systems  HENT: Negative for hearing loss.   Eyes: Negative for blurred vision and double vision.  Respiratory: Positive for cough. Negative for shortness of breath.   Cardiovascular: Negative for chest pain and palpitations.  Gastrointestinal: Positive for abdominal pain and constipation. Negative for nausea.  Genitourinary: Negative for dysuria and urgency.      Musculoskeletal: Positive for back pain.  Skin: Negative for itching and rash.  Neurological: Positive for dizziness, tingling, sensory change, focal weakness and weakness. Negative for headaches.  All other systems reviewed and are negative.       Past Medical History:  Diagnosis Date  . Asthma    child  . Back pain   . Diabetes mellitus without complication (HCC)   . Hypertension          Past Surgical History:  Procedure Laterality Date  . KNEE ARTHROSCOPY Right   . TRANSFORAMINAL LUMBAR INTERBODY FUSION (TLIF) WITH PEDICLE SCREW FIXATION 1 LEVEL N/A 06/03/2016   Procedure: TRANSFORAMINAL LUMBAR INTERBODY FUSION (TLIF) WITH PEDICLE SCREW FIXATION 1 LEVEL;  Surgeon: Venita Lick, MD;  Location[facility];  Service: Orthopedics;  Laterality: N/A;              Allergies  Allergen Reactions  . Other     Tide Soap         Medications Prior to Admission  Medication Sig Dispense Refill  . glipiZIDE (GLUCOTROL XL) 2.5 MG 24 hr tablet Take 2.5 mg by mouth daily with breakfast.    . ibuprofen (ADVIL,MOTRIN) 800 MG tablet Take 1 tablet (800 mg total) by mouth every 8 (eight) hours as needed. 30 tablet 0  . losartan-hydrochlorothiazide  (HYZAAR) 100-25 MG tablet Take 1 tablet by mouth daily.    . metFORMIN (GLUCOPHAGE) 1000 MG tablet Take 1,000 mg by mouth 2 (two) times daily with a meal.    . Multiple Vitamin (MULTIVITAMIN WITH MINERALS) TABS tablet Take 1 tablet by mouth daily.    . pravastatin (PRAVACHOL) 20 MG tablet Take 20 mg by mouth daily.    . sildenafil (VIAGRA) 100 MG tablet Take 100 mg by mouth daily as needed for erectile dysfunction.       BP Readings from Last 3 Encounters:  06/06/16 133/89  06/05/16 (!) 144/102  05/26/16 (!) 152/97    CBG (last 3)   Recent Labs  06/05/16 1650 06/05/16 2034 06/06/16 0723  GLUCAP 170* 205* 94     Physical Exam: Blood pressure 133/89,  pulse 83, temperature 98.9 F (37.2 C), temperature source Oral, resp. rate 20, weight 85.3 kg (188 lb), SpO2 97 %. Physical Exam  Vitals reviewed. Constitutional: He is oriented to person, place, and time. He appears well-developed and well-nourished.   HENT:  Head: Normocephalic and atraumatic.  Eyes: Conjunctivae and EOM are normal. Pupils are equal, round, and reactive to light. No scleral icterus.  Neck: Normal range of motion. Neck supple.  Cardiovascular: Normal rate and regular rhythm.   Respiratory: Effort normal and breath sounds normal.  GI: Soft. Bowel sounds are normal.  Neurological: He is alert and oriented to person, place, and time. Coordination abnormal.  Speech clear.  Pain with minnimal movements Motor: 4/5 throughout (pain inhibition) Sensation intact to light touch   Skin: Skin is warm and dry.  Back honeycomb dressing c/d/i.  Psychiatric:  His speech is delayed. He is slowed.     Lab Results Last 48 Hours        Results for orders placed or performed during the hospital encounter of 06/03/16 (from the past 48 hour(s))  Hemoglobin A1c     Status: Abnormal   Collection Time: 06/03/16  3:02 PM  Result Value Ref Range   Hgb A1c MFr Bld 8.1 (H) 4.8 - 5.6 %    Comment: (NOTE)          Pre-diabetes: 5.7 - 6.4         Diabetes: >6.4         Glycemic control for adults with diabetes: <7.0   Mean Plasma Glucose 186 mg/dL    Comment: (NOTE) [facility]formed At[facility] [address] [place] [id number] Mila HomerHancock William F MD [id number]:[phone]  Glucose, capillary     Status: Abnormal   Collection Time: 06/03/16  3:29 PM  Result Value Ref Range   Glucose-Capillary 157 (H) 65 - 99 mg/dL   Comment 1 Notify RN    Comment 2 Document in Chart   Glucose, capillary     Status: Abnormal   Collection Time: 06/03/16  5:57 PM  Result Value Ref Range   Glucose-Capillary 165 (H) 65 - 99 mg/dL   Comment 1 Document in Chart   Glucose, capillary     Status: Abnormal   Collection Time: 06/03/16  9:35 PM  Result Value Ref Range   Glucose-Capillary 167 (H) 65 - 99 mg/dL   Comment 1 Notify RN    Comment 2 Document in Chart   Glucose, capillary     Status: Abnormal   Collection Time: 06/04/16  8:18 AM  Result Value Ref Range   Glucose-Capillary 150 (H) 65 - 99 mg/dL  Glucose, capillary     Status: Abnormal   Collection Time: 06/04/16 11:59 AM  Result Value Ref Range   Glucose-Capillary 172 (H) 65 - 99 mg/dL  Glucose, capillary     Status: Abnormal   Collection Time: 06/04/16  5:28 PM  Result Value Ref Range   Glucose-Capillary 142 (H) 65 - 99 mg/dL  Glucose, capillary     Status: Abnormal   Collection Time: 06/04/16  9:29 PM  Result Value Ref Range   Glucose-Capillary 140 (H) 65 - 99 mg/dL   Comment 1 Notify RN    Comment 2 Document in Chart   Glucose, capillary     Status: Abnormal   Collection Time: 06/05/16  8:26 AM  Result Value Ref Range   Glucose-Capillary 152 (H) 65 - 99 mg/dL  Glucose, capillary     Status: Abnormal   Collection Time: 06/05/16 12:35 PM  Result Value Ref Range   Glucose-Capillary 142 (H) 65 - 99 mg/dL      Imaging Results (Last 48 hours)  Dg Lumbar Spine 2-3 Views  Result Date:  06/04/2016 CLINICAL DATA:  Postoperative pain.  Lumbar fusion EXAM: LUMBAR SPINE - 2-3 VIEW COMPARISON:  Yesterday FINDINGS: Recent L4-5 discectomy with stable unremarkable positioning of intervertebral cage. Posterior fusion hardware is intact. Slight residual anterolisthesis is stable. No evidence of osseous fracture. Advanced L1-2 disc degeneration. IMPRESSION: No acute finding. Stable hardware positioning compared to placement fluoroscopy. Electronically Signed   By: Marnee SpringJonathon  Watts M.D.   On: 06/04/2016  07:35        Medical Problem List and Plan: 1.  Gait abnormality secondary to RLE radiculopathy. 2.  DVT Prophylaxis/Anticoagulation: Pharmaceutical: Lovenox 3. Pain Management:  Managed on oxycodone prn. Will add low dose gabapentin to help manage neuropathy.    4. Skin/Wound Care: Monitor wound daily for healing.   5. T2DM: Monitor BS ac/hs. Continue glucotrol Xl daily and metformin bid.  6. HTN: Monitor BP bid. Continue cozaar, HCTZ.     Rogelia Boga, MD

## 2016-06-07 ENCOUNTER — Inpatient Hospital Stay (HOSPITAL_COMMUNITY): Payer: Self-pay | Admitting: Physical Therapy

## 2016-06-07 LAB — GLUCOSE, CAPILLARY
GLUCOSE-CAPILLARY: 155 mg/dL — AB (ref 65–99)
GLUCOSE-CAPILLARY: 110 mg/dL — AB (ref 65–99)
Glucose-Capillary: 123 mg/dL — ABNORMAL HIGH (ref 65–99)
Glucose-Capillary: 131 mg/dL — ABNORMAL HIGH (ref 65–99)

## 2016-06-07 LAB — URINE CULTURE: Culture: 3000 — AB

## 2016-06-07 NOTE — Progress Notes (Signed)
Patient ID: Jacob Bond, male   DOB: 08-16-1956, 60 y.o.   MRN: [id number]  06/07/16.  Patient ID: Jacob Bond, male   DOB: Jun 10, 1956, 60 y.o.   MRN: [id number]  Jacob Bond a 60 y.o.malewith history of DM, HTN, back injury with RLE neuropathy.  He was admitted on 06/03/16 for transforaminal L4-L5 fusion by Dr. Gayland Curry on 06/03/16. CIR recommended for follow up therapy.  Fairly comfortable night with modest back and leg pain.  Remains stable; no new concerns.  Review of Systems  HENT: Negative for hearing loss.   Eyes: Negative for blurred vision and double vision.  Respiratory: Positive for cough. Negative for shortness of breath.   Cardiovascular: Negative for chest pain and palpitations.  Gastrointestinal: Positive for abdominal pain and constipation. Negative for nausea.  Genitourinary: Negative for dysuria and urgency.      Musculoskeletal: Positive for back pain.  Skin: Negative for itching and rash.  Neurological: Positive for dizziness, tingling, sensory change, focal weakness and weakness. Negative for headaches.  All other systems reviewed and are negative.       Past Medical History:  Diagnosis Date  . Asthma    child  . Back pain   . Diabetes mellitus without complication (HCC)   . Hypertension          Past Surgical History:  Procedure Laterality Date  . KNEE ARTHROSCOPY Right   . TRANSFORAMINAL LUMBAR INTERBODY FUSION (TLIF) WITH PEDICLE SCREW FIXATION 1 LEVEL N/A 06/03/2016   Procedure: TRANSFORAMINAL LUMBAR INTERBODY FUSION (TLIF) WITH PEDICLE SCREW FIXATION 1 LEVEL;  Surgeon: Venita Lick, MD;  Location: [facility];  Service: Orthopedics;  Laterality: N/A;              Allergies  Allergen Reactions  . Other     Tide Soap         Medications Prior to Admission  Medication Sig Dispense Refill  . glipiZIDE (GLUCOTROL XL) 2.5 MG 24 hr tablet Take 2.5 mg by mouth daily with breakfast.    . ibuprofen (ADVIL,MOTRIN) 800 MG  tablet Take 1 tablet (800 mg total) by mouth every 8 (eight) hours as needed. 30 tablet 0  . losartan-hydrochlorothiazide (HYZAAR) 100-25 MG tablet Take 1 tablet by mouth daily.    . metFORMIN (GLUCOPHAGE) 1000 MG tablet Take 1,000 mg by mouth 2 (two) times daily with a meal.    . Multiple Vitamin (MULTIVITAMIN WITH MINERALS) TABS tablet Take 1 tablet by mouth daily.    . pravastatin (PRAVACHOL) 20 MG tablet Take 20 mg by mouth daily.    . sildenafil (VIAGRA) 100 MG tablet Take 100 mg by mouth daily as needed for erectile dysfunction.       BP Readings from Last 3 Encounters:  06/07/16 139/78  06/05/16 (!) 144/102  05/26/16 (!) 152/97    CBG (last 3)   Recent Labs  06/06/16 1633 06/06/16 2041 06/07/16 0632  GLUCAP 134* 76 123*     Physical Exam: Blood pressure 133/89,  pulse 83, temperature 98.9 F (37.2 C), temperature source Oral, resp. rate 20, weight 85.3 kg (188 lb), SpO2 97 %. Physical Exam  Vitals reviewed. Constitutional: He is oriented to person, place, and time. He appears well-developed and well-nourished.   HENT:  Head: Normocephalic and atraumatic.  Eyes: Conjunctivae and EOM are normal. Pupils are equal, round, and reactive to light. No scleral icterus.  Neck: Normal range of motion. Neck supple.  Cardiovascular: Normal rate and regular rhythm.   Respiratory: Effort normal and breath  sounds normal.  GI: Soft. Bowel sounds are normal.  Neurological: He is alert and oriented to person, place, and time. Coordination abnormal.  Speech clear.   Skin: Skin is warm and dry.   Psychiatric:  Alert and appropriate   Lab Results Last 48 Hours        Results for orders placed or performed during the hospital encounter of 06/03/16 (from the past 48 hour(s))  Hemoglobin A1c     Status: Abnormal   Collection Time: 06/03/16  3:02 PM  Result Value Ref Range   Hgb A1c MFr Bld 8.1 (H) 4.8 - 5.6 %    Comment: (NOTE)         Pre-diabetes: 5.7 -  6.4         Diabetes: >6.4         Glycemic control for adults with diabetes: <7.0   Mean Plasma Glucose 186 mg/dL    Comment: (NOTE) Performed At: [name] [address] [place] [id number] Mila HomerHancock William F MD MV:[id number]:[phone]  Glucose, capillary     Status: Abnormal   Collection Time: 06/03/16  3:29 PM  Result Value Ref Range   Glucose-Capillary 157 (H) 65 - 99 mg/dL   Comment 1 Notify RN    Comment 2 Document in Chart   Glucose, capillary     Status: Abnormal   Collection Time: 06/03/16  5:57 PM  Result Value Ref Range   Glucose-Capillary 165 (H) 65 - 99 mg/dL   Comment 1 Document in Chart   Glucose, capillary     Status: Abnormal   Collection Time: 06/03/16  9:35 PM  Result Value Ref Range   Glucose-Capillary 167 (H) 65 - 99 mg/dL   Comment 1 Notify RN    Comment 2 Document in Chart   Glucose, capillary     Status: Abnormal   Collection Time: 06/04/16  8:18 AM  Result Value Ref Range   Glucose-Capillary 150 (H) 65 - 99 mg/dL  Glucose, capillary     Status: Abnormal   Collection Time: 06/04/16 11:59 AM  Result Value Ref Range   Glucose-Capillary 172 (H) 65 - 99 mg/dL  Glucose, capillary     Status: Abnormal   Collection Time: 06/04/16  5:28 PM  Result Value Ref Range   Glucose-Capillary 142 (H) 65 - 99 mg/dL  Glucose, capillary     Status: Abnormal   Collection Time: 06/04/16  9:29 PM  Result Value Ref Range   Glucose-Capillary 140 (H) 65 - 99 mg/dL   Comment 1 Notify RN    Comment 2 Document in Chart   Glucose, capillary     Status: Abnormal   Collection Time: 06/05/16  8:26 AM  Result Value Ref Range   Glucose-Capillary 152 (H) 65 - 99 mg/dL  Glucose, capillary     Status: Abnormal   Collection Time: 06/05/16 12:35 PM  Result Value Ref Range   Glucose-Capillary 142 (H) 65 - 99 mg/dL      Imaging Results (Last 48 hours)  Dg Lumbar Spine 2-3 Views  Result Date: 06/04/2016 CLINICAL DATA:  Postoperative  pain.  Lumbar fusion EXAM: LUMBAR SPINE - 2-3 VIEW COMPARISON:  Yesterday FINDINGS: Recent L4-5 discectomy with stable unremarkable positioning of intervertebral cage. Posterior fusion hardware is intact. Slight residual anterolisthesis is stable. No evidence of osseous fracture. Advanced L1-2 disc degeneration. IMPRESSION: No acute finding. Stable hardware positioning compared to placement fluoroscopy. Electronically Signed   By: Marnee SpringJonathon  Watts M.D.   On: 06/04/2016 07:35  Medical Problem List and Plan: 1.  Gait abnormality secondary to RLE radiculopathy. 2.  DVT Prophylaxis/Anticoagulation: Pharmaceutical: Lovenox 3. Pain Management:  Managed on oxycodone prn. Will add low dose gabapentin to help manage neuropathy.    4. Skin/Wound Care: Monitor wound daily for healing.   5. T2DM: Monitor BS ac/hs. Continue glucotrol Xl daily and metformin bid.  6. HTN: Monitor BP bid. Continue cozaar, HCTZ.     Rogelia Boga, MD

## 2016-06-07 NOTE — Progress Notes (Addendum)
Physical Therapy Session Note  Patient Details  Name: Jacob Bond MRN: [id number] Date of Birth: 1956-09-08  Today's Date: 06/07/2016 PT Individual Time: 1621-1700 PT Individual Time Calculation (min): 39 min    Short Term Goals: Week 1:  PT Short Term Goal 1 (Week 1): STG=LTG due to short LOS  Skilled Therapeutic Interventions/Progress Updates:    Pt received in w/c & agreeable to PT noting 7/10 pain in B thighs but denying pain medication. Educated pt on preventing pain from becoming excruciating and allowing him to participate in PT & pt voiced understanding. Session focused on w/c mobility & gait training. Pt propelled w/c 200 ft + 225 ft with BUE  & supervision for cardiovascular endurance training. Gait training x 35 ft + 55 ft + 15 ft with RW & steady A. Pt with decreased step length, decreased stride length & decreased gait speed. Pt relies heavily on BUE for support when ambulating. Educated pt on transfer training for anterior weight shift to increase ease of sit>stand with pt return demonstrating after multiple trials. Attempted to utilize nu-step but pt only able to tolerate Level 1 x 3 minutes before ceasing activity 2/2 increasing back & BLE pain. Pt only able to recall 1/3 back precautions on this date with PT educating pt on "BAT" acronym & providing specific examples on objects weighing >5 lbs. At end of session pt left sitting in w/c in room with all needs within reach & set up with meal tray. Notified RN of pt's request for pain medication.  Addendum: Focused on d/c planning during session. Pt reports he hopes to d/c home [place] family in [place] but is not sure this is possible because he is unsure if he has to follow up with MD down here in [place] or if he can follow up with another MD in [place]. Pt also unsure of transportation to [place]; educated pt to ask MD when he would be cleared to fly & pt voiced understanding. Otherwise pt would have to have someone drive him to  [place].  Therapy Documentation Precautions:  Precautions Precautions: Back, Fall Precaution Booklet Issued: Yes (comment) Precaution Comments: Pt aware of all precautions Required Braces or Orthoses: Spinal Brace Spinal Brace: Lumbar corset, Applied in sitting position Restrictions Weight Bearing Restrictions: No  Pain: Pain Assessment Pain Assessment: 0-10 Pain Score: 7  Pain Location: Leg Pain Orientation: Right;Left Pain Intervention(s):  (denied pain medication)   See Function Navigator for Current Functional Status.   Therapy/Group: Individual Therapy  Sandi MariscalVictoria M Kaniah Rizzolo 06/07/2016, 5:11 PM

## 2016-06-07 NOTE — Progress Notes (Signed)
Complains of burning and tingling pain to bilateral upper thighs and feet. Jacob Bond, Jacob Bond

## 2016-06-08 ENCOUNTER — Inpatient Hospital Stay (HOSPITAL_COMMUNITY): Payer: Self-pay | Admitting: Physical Therapy

## 2016-06-08 ENCOUNTER — Inpatient Hospital Stay (HOSPITAL_COMMUNITY): Payer: Self-pay | Admitting: Occupational Therapy

## 2016-06-08 DIAGNOSIS — M5410 Radiculopathy, site unspecified: Secondary | ICD-10-CM

## 2016-06-08 DIAGNOSIS — D72829 Elevated white blood cell count, unspecified: Secondary | ICD-10-CM

## 2016-06-08 DIAGNOSIS — M792 Neuralgia and neuritis, unspecified: Secondary | ICD-10-CM

## 2016-06-08 DIAGNOSIS — E119 Type 2 diabetes mellitus without complications: Secondary | ICD-10-CM

## 2016-06-08 DIAGNOSIS — D62 Acute posthemorrhagic anemia: Secondary | ICD-10-CM

## 2016-06-08 DIAGNOSIS — I10 Essential (primary) hypertension: Secondary | ICD-10-CM

## 2016-06-08 LAB — GLUCOSE, CAPILLARY
GLUCOSE-CAPILLARY: 110 mg/dL — AB (ref 65–99)
Glucose-Capillary: 172 mg/dL — ABNORMAL HIGH (ref 65–99)
Glucose-Capillary: 75 mg/dL (ref 65–99)
Glucose-Capillary: 97 mg/dL (ref 65–99)

## 2016-06-08 MED ORDER — GABAPENTIN 300 MG PO CAPS
300.0000 mg | ORAL_CAPSULE | Freq: Three times a day (TID) | ORAL | Status: DC
  Administered 2016-06-08 – 2016-06-13 (×16): 300 mg via ORAL
  Filled 2016-06-08 (×16): qty 1

## 2016-06-08 MED ORDER — POTASSIUM CHLORIDE CRYS ER 20 MEQ PO TBCR
20.0000 meq | EXTENDED_RELEASE_TABLET | Freq: Every day | ORAL | Status: DC
  Administered 2016-06-08 – 2016-06-13 (×6): 20 meq via ORAL
  Filled 2016-06-08 (×6): qty 1

## 2016-06-08 MED ORDER — OXYCODONE HCL 5 MG PO TABS: 10.0000 mg | ORAL_TABLET | Freq: Once | ORAL | Status: DC

## 2016-06-08 MED ORDER — POLYETHYLENE GLYCOL 3350 17 G PO PACK
17.0000 g | PACK | Freq: Two times a day (BID) | ORAL | Status: DC
  Administered 2016-06-08 – 2016-06-13 (×10): 17 g via ORAL
  Filled 2016-06-08 (×11): qty 1

## 2016-06-08 NOTE — Progress Notes (Signed)
Occupational Therapy Session Note  Patient Details  Name: Jacob Bond MRN: [id number] Date of Birth: June 21, 1956  Today's Date: 06/08/2016 OT Individual Time: 1000-1115 OT Individual Time Calculation (min): 75 min     Short Term Goals:Week 1:  OT Short Term Goal 1 (Week 1): STG=LTG due to LOS  Skilled Therapeutic Interventions/Progress Updates:    Pt seen for skilled OT session focusing on activity tolerance, balance, and functional mobility. Pt supine in bed and agreeable to tx session upon arrival. Pt required VC to scoot to edge of w/c to stand and not to arch back and was able to ambulate approximately 15 feet throughout session with RW needing frequent rest breaks due to neuropathic pain. In gym pt stood on wedge, but due to increased stretch/pain BLE, OT had pt stand on foam mat completing pipe tree and ball catching/throwing activities to improve dynamic standing balance.  OT demonstrated UB exercises and had pt perform 1x 10 reps of bicep curls, shoulder flexion, shoulder extension, and seated rows with a #3 weight. Pt practiced sit to stand from couch in rehab apartment in preparation for re-entering community and to strengthen hamstrings. OT educated pt on "nose over toes" technique with pt needing min A sit to stand due to decreased balance and strength standing from low surfaces.  At this time pt is unsure of d/c disposition, but OT educated pt on shower safety and had pt practice using tub transfer bench in preparation for d/c with daughter or friend in [place].Pt left supine in bed with all needs met. Therapy Documentation Precautions:  Precautions Precautions: Back, Fall Precaution Booklet Issued: Yes (comment) Precaution Comments: Pt aware of all precautions Required Braces or Orthoses: Spinal Brace Spinal Brace: Lumbar corset, Applied in sitting position Restrictions Weight Bearing Restrictions: No Pain: Pain Assessment Pain Assessment: No/denies pain Pain Score: 7   Pain Type: Neuropathic pain Pain Location: Leg Pain Orientation: Right;Left Pain Descriptors / Indicators: Burning Pain Onset: On-going Patients Stated Pain Goal: 4 Pain Intervention(s): Repositioned Multiple Pain Sites: No  See Function Navigator for Current Functional Status.   Therapy/Group: Individual Therapy  Matilde Bash 06/08/2016, 10:26 AM

## 2016-06-08 NOTE — Progress Notes (Signed)
[facility] Individual Statement of Services  Patient Name:  Jacob Bond  Date:  06/08/2016  Welcome to the [facility].  Our goal is to provide you with an individualized program based on your diagnosis and situation, designed to meet your specific needs.  With this comprehensive rehabilitation program, you will be expected to participate in at least 3 hours of rehabilitation therapies Monday-Friday, with modified therapy programming on the weekends.  Your rehabilitation program will include the following services:  Physical Therapy (PT), Occupational Therapy (OT), 24 hour per day rehabilitation nursing, Neuropsychology, Case Management (Social Worker), Rehabilitation Medicine, Nutrition Services and Pharmacy Services  Weekly team conferences will be held on Tuesdays to discuss your progress.  Your Social Worker will talk with you frequently to get your input and to update you on team discussions.  Team conferences with you and your family in attendance may also be held.  Expected length of stay: 5 to 7 days  Overall anticipated outcome:  Modified independent with supervision for shower transfers, car transfers, community ambulation, and stairs  Depending on your progress and recovery, your program may change. Your Social Worker will coordinate services and will keep you informed of any changes. Your Social Worker's name and contact numbers are listed  below.  The following services may also be recommended but are not provided by the [facility]:   Driving Evaluations  Home Health Rehabiltiation Services  Outpatient Rehabilitation Services  Vocational Rehabilitation   Arrangements will be made to provide these services after discharge if needed.  Arrangements include referral to agencies that provide these services.  Your insurance has been verified to be:  Workers' PsychiatristCompensation Your primary doctor is:  Dr. Cloyde Reamsobert  [place]  Pertinent information will be shared with your doctor and your insurance company.  Social Worker:  Staci AcostaJenny Regene Mccarthy, LCSW  [phone] or ([phone]) [phone]  Information discussed with and copy given to patient by: Elvera LennoxPrevatt, Caprice Wasko Capps, 06/08/2016, 2:34 PM

## 2016-06-08 NOTE — Progress Notes (Signed)
Physical Therapy Session Note  Patient Details  Name: Jacob Bond MRN: [id number] Date of Birth: [birth year]  Today's Date: 06/08/2016 PT Individual Time: 0800-0900 and 1430-1530 PT Individual Time Calculation (min): 60 min and 60 min (total 120 min)    Short Term Goals: Week 1:  PT Short Term Goal 1 (Week 1): STG=LTG due to short LOS  Skilled Therapeutic Interventions/Progress Updates:   Tx 1: Pt received seated in w/c with RN present; c/o 8/10 pain in B quads and reports taking medication recently. W/c propulsion to gym with BUE x200' with modI. Gait in parallel bars forward/backward/sideways for LE strengthening, coordination. Standing heel raises 2x10 with light UE support. Alternating step ups to 3" step with light UE support. Discussed d/c plan with pt; reports he has follow up visits and will likely be going to friend's house in [place]. Will continue discussion with CSW and treatment team. Gait x160' with RW and min guard/S; occasional short standing rest breaks with pt massaging B quads with report of increased pain. Returned to room with w/c propulsion. Stand pivot to return to bed with RW and min guard; sit >supine with S and bedrails. Educated pt regarding falls prevention safety and request that pt use call bell for assistance before getting up; pt reports "I'll follow the rules, don't worry". Remained supine in bed at end of session, all needs in reach.   tx 2: Pt received seated in w/c, c/o pain as described below and agreeable to treatment. Attempted to ambulate without AD and use of rail in hall as needed; pt very fearful and pain-focused, with extremely slow speed and frequently stopping to massage quads before continuing. Able to ambulate 15' with min guard. Stairs 1x8 on 3" stairs, 2x4 on 6" stairs with B handrails and min guard. Sit <>stand 3x5 reps with table progressively lowered, and progressed BUE support on table to no UE support; S overall. Standing tolerance on small wedge  for gastroc/soleus while engaged in dynamic UE activity. Returned to room w/c propulsion with mod I. Remained seated on toilet with instruction to use call bell when finished; NT alerted to pt position.   Therapy Documentation Precautions:  Precautions Precautions: Back, Fall Precaution Booklet Issued: Yes (comment) Precaution Comments: Pt aware of all precautions Required Braces or Orthoses: Spinal Brace Spinal Brace: Lumbar corset, Applied in sitting position Restrictions Weight Bearing Restrictions: No Pain: Pain Assessment Pain Assessment: 0-10 Pain Score: 8  Pain Type: Acute pain;Neuropathic pain Pain Location: Leg Pain Orientation: Right;Left Pain Descriptors / Indicators: Burning;Tingling Pain Frequency: Constant Pain Onset: On-going Patients Stated Pain Goal: 4 Pain Intervention(s): Other (Comment) (pre-medicated) Multiple Pain Sites: No   See Function Navigator for Current Functional Status.   Therapy/Group: Individual Therapy  [name] 06/08/2016, 8:58 AM

## 2016-06-08 NOTE — IPOC Note (Addendum)
Overall Plan of Care [facility](IPOC) Patient Details Name: Jacob Bond MRN: [id number] DOB: 1956-01-31  Admitting Diagnosis: L4-5 Fusion  Hospital Problems: Active Problems:   Spondylolisthesis of lumbar region   Neuropathic pain   Radicular low back pain   Leukocytosis   Acute blood loss anemia   Type 2 diabetes mellitus without complication, without long-term current use of insulin (HCC)     Functional Problem List: Nursing Bowel, Endurance, Motor, Pain, Safety, Skin Integrity  PT Balance, Endurance, Nutrition, Pain, Safety, Sensory, Skin Integrity  OT Pain, Endurance, Safety, Balance  SLP    TR         Basic ADL's: OT Bathing, Dressing, Toileting     Advanced  ADL's: OT Laundry     Transfers: PT Bed Mobility, Bed to Chair, Car, Occupational psychologisturniture  OT Toilet, Research scientist (life sciences)Tub/Shower     Locomotion: PT Ambulation, Stairs     Additional Impairments: OT None  SLP        TR      Anticipated Outcomes Item Anticipated Outcome  Self Feeding Mod I  Swallowing      Basic self-care  Mod I  Toileting  Mod I   Bathroom Transfers Supervision  Bowel/Bladder  Continent to bowel and bladder with min. assisst.  Transfers  Supervision  Locomotion  Supervision x150  Communication     Cognition     Pain  Less than 3 on 1 to 10 scale.  Safety/Judgment  Free from fall during his stay in rehab   Therapy Plan: PT Intensity: Minimum of 1-2 x/day ,45 to 90 minutes PT Frequency: 5 out of 7 days PT Duration Estimated Length of Stay: 5-7 days OT Intensity: Minimum of 1-2 x/day, 45 to 90 minutes OT Frequency: 5 out of 7 days OT Duration/Estimated Length of Stay: 5-7 days         Team Interventions: Nursing Interventions Patient/Family Education, Bowel Management, Pain Management, Skin Care/Wound Management, Psychosocial Support  PT interventions Ambulation/gait training, Warden/rangerBalance/vestibular training, Community reintegration, Discharge planning, DME/adaptive equipment instruction, Disease  management/prevention, Functional mobility training, Neuromuscular re-education, Pain management, Patient/family education, Psychosocial support, Skin care/wound management, Splinting/orthotics, Stair training, Therapeutic Activities, Therapeutic Exercise, UE/LE Strength taining/ROM, Wheelchair propulsion/positioning  OT Interventions Warden/rangerBalance/vestibular training, Discharge planning, Functional mobility training, Pain management, Therapeutic Activities, Therapeutic Exercise, Self Care/advanced ADL retraining, Patient/family education  SLP Interventions    TR Interventions    SW/CM Interventions Discharge Planning, Psychosocial Support, Patient/Family Education    Team Discharge Planning: Destination: PT-Home ,OT- Home , SLP-  Projected Follow-up: PT-Home health PT, 24 hour supervision/assistance, OT-  None, SLP-  Projected Equipment Needs: PT-Rolling walker with 5" wheels, OT- To be determined, SLP-  Equipment Details: PT-Not likely to need WC, OT-  Patient/family involved in discharge planning: PT- Patient,  OT-Patient, SLP-   MD ELOS: ~5 days. Medical Rehab Prognosis:  Good Assessment:  60 y.o.malewith history of DM, HTN, back injury with RLE neuropathy with work up revealing degenerative spondylolisthesis L4/5 with RLE weakness.. Failed conservative management. He was admitted on 06/03/16 for transforaminal L4-L5 fusion by Dr. Gayland CurryBoorks on 06/03/16. Patient requiring assistance with ADL tasks as well as mobility.  Post op has had neuropathy BLE as well as pain affecting mobility. Will set goals for Mod I with therapies.    See Team Conference Notes for weekly updates to the plan of care

## 2016-06-08 NOTE — Plan of Care (Signed)
Problem: RH PAIN MANAGEMENT Goal: RH STG PAIN MANAGED AT OR BELOW PT'S PAIN GOAL Less than 3,on 1 to 10 scale.  Outcome: Not Progressing Continuously rating pain > 3.

## 2016-06-08 NOTE — Discharge Summary (Addendum)
Physician Discharge Summary  Patient ID: Jacob Bond MRN: [id number] DOB/AGE: 60-22-1957 60 y.o.  Admit date: 06/05/2016 Discharge date: 06/13/2016  Discharge Diagnoses:  Active Problems:   Spondylolisthesis of lumbar region   Neuropathic pain   Radicular low back pain   Leukocytosis   Acute blood loss anemia   Type 2 diabetes mellitus without complication, without long-term current use of insulin (HCC)   Hematuria   Discharged Condition: stable      Labs:  Basic Metabolic Panel: BMP Latest Ref Rng & Units 06/06/2016 05/26/2016  Glucose 65 - 99 mg/dL 782(N154(H) 562(Z191(H)  BUN 6 - 20 mg/dL 9 13  Creatinine 3.080.61 - 1.24 mg/dL 6.571.11 8.461.13  Sodium 962135 - 145 mmol/L 136 139  Potassium 3.5 - 5.1 mmol/L 3.4(L) 3.8  Chloride 101 - 111 mmol/L 97(L) 105  CO2 22 - 32 mmol/L 30 28  Calcium 8.9 - 10.3 mg/dL 9.5(M8.4(L) 9.4    CBC:  Recent Labs Lab 06/06/16 0831 06/09/16 1225 06/10/16 1058  WBC 10.7* 10.5 9.3  NEUTROABS 7.4  --   --   HGB 11.3* 12.4* 12.5*  HCT 33.7* 37.2* 37.8*  MCV 96.0 97.6 97.9  PLT 260 354 402*    CBG:  Recent Labs Lab 06/11/16 1124 06/11/16 2110 06/12/16 0645 06/12/16 1145 06/12/16 1637  GLUCAP 108* 181* 89 91 147*    Brief HPI:   Jacob Dabneyis a 60 y.o.malewith history of DM, HTN, back injury with RLE neuropathy with work up revealing degenerative spondylolisthesis L4/5 with RLE weakness.. Failed conservative management. He was admitted on 06/03/16 for transforaminal L4-L5 fusion by Dr. Gayland CurryBoorks on 06/03/16. Therapy evaluations completed and patient requiring assistance with ADL tasks as well as mobility.  Post op has had neuropathy BLE as well as pain affecting mobility. CIR recommended for follow up therapy.   Hospital Course: Jacob Bond was admitted to rehab 06/05/2016 for inpatient therapies to consist of PT, ST and OT at least three hours five days a week. Past admission physiatrist, therapy team and rehab RN have worked together to provide  customized collaborative inpatient rehab. He continued to report problems with neuropathy BLE as well as back pain affecting mobility and activity tolerance. He was started on gabapentin and this was titrated upwards to 300 mg tid for symptom management. He was also premedicated with oxycodone prior to therapy to help with tolerance of activity.  He did develop hematuria with clots and renal ultrasound was done showing right renal cyst and enlarged prostate. Lovenox was discontinued and UA/UCS ordered was negative for infection.  Dr. McDiarmid was consulted for input and felt that no further work up indicated at this time as patient was voiding without difficulty. He [facility] of abnormal PSA, cystoscopy and possible CT scan.   Follow up CBC revealed that ABLA is stable. He was found to have hypokalemia on check of lytes and  K dur was added for supplement due to HCTZ. Back incision has been healing well without signs or symptoms of infection. Blood sugars have been reasonably controlled overall. Po intake has been good and patient is continent of bowel and bladder. He continues to have clots in urine and has elected on follow up with Dr. McDiarmid in [place].   He has made steady progress during his rehab stay and is modified independent in supervised setting.  Follow up therapy to be set up by [employer] and patient was [facility] on  06/13/16   Rehab course: During patient's stay in rehab weekly team conferences were held to monitor patient's progress, set goals and discuss barriers to discharge. At admission patient required min assist with basic self care tasks and moderate assist with mobility with decreased standing balance. He has had improvement in activity tolerance, balance, postural control, as well as ability to compensate for deficits. He has made steady improvement in activity and is currently modified independent for  ADL tasks and requires supervision with IADLs. He is modified independent for transfers and to ambulate 300' with small based quad cane.  He requires supervision with stairs, community setting and when in unfamiliar environment. BERG balance score at 45/56.    Disposition: Assisted Living Facility.   Diet: Regular.   Special Instructions: 1. Back precautions. No driving or strenuous activity till cleared by MD.  2. Wear LSO when at EOB or out of bed.    Discharge Instructions    Ambulatory referral to Physical Medicine Rehab    Complete by:  As directed   Follow up in one week       Medication List    STOP taking these medications   ondansetron 4 MG tablet Commonly known as:  ZOFRAN   oxyCODONE-acetaminophen 10-325 MG tablet Commonly known as:  PERCOCET     TAKE these medications   gabapentin 300 MG capsule Commonly known as:  NEURONTIN Take 1 capsule (300 mg total) by mouth 3 (three) times daily.   glipiZIDE 2.5 MG 24 hr tablet Commonly known as:  GLUCOTROL XL Take 1 tablet (2.5 mg total) by mouth daily with breakfast.   losartan-hydrochlorothiazide 100-25 MG tablet Commonly known as:  HYZAAR Take 1 tablet by mouth daily.   lubiprostone 24 MCG capsule Commonly known as:  AMITIZA Take 1 capsule (24 mcg total) by mouth 2 (two) times daily with a meal.   metFORMIN 1000 MG tablet Commonly known as:  GLUCOPHAGE Take 1 tablet (1,000 mg total) by mouth 2 (two) times daily with a meal.   methocarbamol 750 MG tablet Commonly known as:  ROBAXIN Take 1 tablet (750 mg total) by mouth 4 (four) times daily as needed for muscle spasms. What changed:  medication strength  how much to take  when to take this   Oxycodone HCl 10 MG Tabs Take 1 tablet (10 mg total) by mouth every 6 (six) hours as needed for severe pain.   polyethylene glycol packet Commonly known as:  MIRALAX / GLYCOLAX Take 17 g by mouth 2 (two) times daily.   potassium chloride SA 20 MEQ  tablet Commonly known as:  K-DUR,KLOR-CON Take 1 tablet (20 mEq total) by mouth daily.   pravastatin 20 MG tablet Commonly known as:  PRAVACHOL Take 1 tablet (20 mg total) by mouth daily.      Follow-up Information    Ranelle Oyster, MD .   Specialty:  Physical Medicine and Rehabilitation Why:  office will call you with follow up appointment Contact information: [address] [place] [address] [phone]        Alvy Beal, MD .   Specialty:  Orthopedic Surgery Why:  Call for follow up appointment Contact information: [address] [place] [address] [phone]        MACDIARMID,SCOTT A, MD Follow up on 07/06/2016.   Specialty:  Urology Why:  Be there at 12:45 pm for follow up appointment.  Contact information: [address] [place] [address] [phone]  SignedJacquelynn Cree: Love, Pamela S 06/12/2016, 6:11 PM

## 2016-06-08 NOTE — Progress Notes (Signed)
Social Work Assessment and Plan  Patient Details  Name: Jacob Bond MRN: [id number] Date of Birth: [birth year]  Today's Date: 06/08/2016  Problem List:  Patient Active Problem List   Diagnosis Date Noted  . Neuropathic pain   . Radicular low back pain   . Leukocytosis   . Acute blood loss anemia   . Type 2 diabetes mellitus without complication, without long-term current use of insulin ([place])   . Spondylolisthesis of lumbar region 06/05/2016  . S/P lumbar fusion   . Surgery, elective   . Benign essential HTN   . Diabetes mellitus type 2 in nonobese ([facility])   . Chronic back pain 06/03/2016   Past Medical History:  Past Medical History:  Diagnosis Date  . Asthma    child  . Back pain   . Diabetes mellitus without complication ([place])   . Hypertension    Past Surgical History:  Past Surgical History:  Procedure Laterality Date  . KNEE ARTHROSCOPY Right   . TRANSFORAMINAL LUMBAR INTERBODY FUSION (TLIF) WITH PEDICLE SCREW FIXATION 1 LEVEL N/A 06/03/2016   Procedure: TRANSFORAMINAL LUMBAR INTERBODY FUSION (TLIF) WITH PEDICLE SCREW FIXATION 1 LEVEL;  Surgeon: Melina Schools, MD;  Location: [facility];  Service: Orthopedics;  Laterality: N/A;   Social History:  reports that he has never smoked. He has never used smokeless tobacco. He reports that he drinks alcohol. He reports that he does not use drugs.  Family / Support Systems Marital Status: Separated How Long?: 4 years Patient Roles: Parent, Other (Comment) (grandfather; employee) Children: Jacob Bond - dtr - [phone] Other Supports: Jacob Bond - mother - [phone] Anticipated Caregiver: self, may return to hotel or may go home with daughter in [place].  May have the option to stay with a friend until follow up appts with doctors in [place] before dtr come to get him to take him to [place]. Ability/Limitations of Caregiver: Dtr in [place] works.  Other family is in [place]. Caregiver Availability: Other (Comment) (Pt is still  trying to figure out his level of support.) Family Dynamics: supportive family  Social History Preferred language: English Religion: Christian Read: Yes Write: Yes Employment Status: Employed (but now disabled due to [place] while on the job) Name of Employer: [employer] Length of Employment: 2 Return to Work Plans: Pt would like to be able to work again, but he knows he needs to allow his back to heal before he goes back to work. Legal History/Current Legal Issues: none reported, besides workers' comp case Guardian/Conservator: N/A - MD had determined that pt is capable of making his own decisions.   Abuse/Neglect Physical Abuse: Denies Verbal Abuse: Denies Sexual Abuse: Denies Exploitation of patient/patient's resources: Denies Self-Neglect: Denies  Emotional Status Pt's affect, behavior and adjustment status: Pt reports feeling a little "down" at times, but is able to take his mind away from things and he feels better.  His faith helps him and his mother and others praying for him help him to feel supported and positive. Recent Psychosocial Issues: Pt is not used to not being able to work and losing his independence.  This has been difficult for him. Psychiatric History: none reported Substance Abuse History: none reported  Patient / Family Perceptions, Expectations & Goals Pt/Family understanding of illness & functional limitations: Pt reports a good understanding of his condition. Premorbid pt/family roles/activities: Pt worked a lot prior to his injury.  He likes going to the movies and watching football and basketball. Anticipated changes in roles/activities/participation: Pt would  like to resume activities when he is able. Pt/family expectations/goals: Pt wants to be "stronger and more comfortable to do what I need to do."  Wants to regain his independence and not be in pain.  Community [employer] Agencies: None Premorbid Home Care/DME Agencies: Other (Comment) (Pt had some  outpt therapy in June.  Agency unknown.) Transportation available at discharge: unknown - CSW will reach out to Workers' Comp case Engineer, technical sales referrals recommended: Neuropsychology  Discharge Planning Living Arrangements: [place]: Children, Parent, Other relatives Type of Residence: Other (Comment) (was living at the [employer] in [place]) [employer]: Insurance Tourist information centre manager (specify name) (Workers' Health and safety inspector) Pensions consultant: Employment Museum/gallery curator Screen Referred: No Living Expenses: Education officer, community Management: Patient Does the patient have any problems obtaining your medications?: No Home Management: Pt was residing in a hotel. Patient/Family Preliminary Plans: Pt is trying to decide what to do after he leaves the hospital.  He wants to go to [facility] to be near family for support/assistance, but needs to coordinate this with his Psychologist, sport and exercise and family and workers' comp agency. Barriers to Discharge: Self care, Family Support (all family is in [place]) Social Work Anticipated Follow Up Needs: HH/OP Expected length of stay: 5-7 days  Clinical Impression CSW met with pt to introduce self and role of CSW, as well as to complete assessment.  Pt was very talkative with CSW and although he is "down" about his situation, he remains hopeful that he will get better and regain his independence.  Pt is trying to figure out how/when to go to [place] to be with his family during his recovery.  Pt has a friend with whom maybe he can stay in [place] until his f/u MD appts and he is cleared to travel.  CSW explained that we would need to coordinate all of this with his doctors and workers' compensation.  Pt expressed understanding.  CSW will reach out to workers' comp Tourist information centre manager and our medical team to start this process.  Pt has supportive family who want him to be with them when the timing is appropriate.  Pt can return to the hotel for a while, if needed, but feels somewhat unsure  about being there alone.  CSW will work on this with pt and assist as needed.  Dondre Catalfamo, Silvestre Mesi 06/08/2016, 2:55 PM

## 2016-06-08 NOTE — Plan of Care (Signed)
Problem: RH Balance Goal: LTG Patient will maintain dynamic standing balance (PT) LTG:  Patient will maintain dynamic standing balance with assistance during mobility activities (PT)  Upgraded d/t d/c plan  Problem: RH Bed to Chair Transfers Goal: LTG Patient will perform bed/chair transfers w/assist (PT) LTG: Patient will perform bed/chair transfers with assistance, with/without cues (PT).  Upgraded d/t d/c plan  Problem: RH Ambulation Goal: LTG Patient will ambulate in controlled environment (PT) LTG: Patient will ambulate in a controlled environment, # of feet with assistance (PT).  Upgraded d/t d/c plan  Goal: LTG Patient will ambulate in home environment (PT) LTG: Patient will ambulate in home environment, # of feet with assistance (PT).  Upgraded d/t d/c plan

## 2016-06-08 NOTE — Progress Notes (Signed)
[facility] PHYSICAL MEDICINE & REHABILITATION     PROGRESS NOTE  Subjective/Complaints:  Pt seen sitting up in his chair.  He complains of lower extremity thigh pain, which has persistent, but worse this AM.    ROS:  +Neuropathic pain. Denies CP, SOB, N/V/D.  Objective: Vital Signs: Blood pressure (!) 145/82, pulse 80, temperature 99.2 F (37.3 C), temperature source Oral, resp. rate 18, height 6\' 1"  (1.854 m), weight 85.3 kg (188 lb), SpO2 100 %. No results found.  Recent Labs  06/06/16 0831  WBC 10.7*  HGB 11.3*  HCT 33.7*  PLT 260    Recent Labs  06/06/16 0831  NA 136  K 3.4*  CL 97*  GLUCOSE 154*  BUN 9  CREATININE 1.11  CALCIUM 8.4*   CBG (last 3)   Recent Labs  06/07/16 1659 06/07/16 2054 06/08/16 0631  GLUCAP 110* 131* 172*    Wt Readings from Last 3 Encounters:  06/06/16 85.3 kg (188 lb)  06/03/16 85.3 kg (188 lb)  05/26/16 85.6 kg (188 lb 12.8 oz)    Physical Exam:  BP (!) 145/82 (BP Location: Left Arm)   Pulse 80   Temp 99.2 F (37.3 C) (Oral)   Resp 18   Ht 6\' 1"  (1.854 m)   Wt 85.3 kg (188 lb)   SpO2 100%   BMI 24.80 kg/m  Constitutional: He appears well-developed and well-nourished.  HENT: Normocephalic and atraumatic.  Eyes: Conjunctivae and EOM are normal.   Cardiovascular: Normal rate and regular rhythm.   Respiratory: Effort normal and breath sounds normal.  GI: Soft. Bowel sounds are normal.  Neurological: He is alert and oriented.  Speech clear.  Motor: 4/5 throughout (pain inhibition) Skin: Skin is warm and dry. Back honeycomb dressing c/d/i.  Psychiatric: He is slowed.    Assessment/Plan: 1. Functional deficits secondary to RLE radiculopathy which require 3+ hours per day of interdisciplinary therapy in a comprehensive inpatient rehab setting. Physiatrist is providing close team supervision and 24 hour management of active medical problems listed below. Physiatrist and rehab team continue to assess barriers to  discharge/monitor patient progress toward functional and medical goals.  Function:  Bathing Bathing position   Position: Wheelchair/chair at sink  Bathing parts Body parts bathed by patient: Right arm, Left arm, Left lower leg, Chest, Abdomen, Front perineal area, Buttocks, Right upper leg, Right lower leg, Left upper leg    Bathing assist Assist Level: Touching or steadying assistance(Pt > 75%)      Upper Body Dressing/Undressing Upper body dressing   What is the patient wearing?: Pull over shirt/dress     Pull over shirt/dress - Perfomed by patient: Thread/unthread right sleeve, Put head through opening, Pull shirt over trunk, Thread/unthread left sleeve          Upper body assist Assist Level: No help, No cues      Lower Body Dressing/Undressing Lower body dressing   What is the patient wearing?: Underwear, Pants, [employer] Chemicaled Hose Underwear - Performed by patient: Thread/unthread left underwear leg, Pull underwear up/down, Thread/unthread right underwear leg   Pants- Performed by patient: Thread/unthread right pants leg, Thread/unthread left pants leg, Pull pants up/down     Non-skid slipper socks- Performed by helper: Don/doff right sock, Don/doff left sock               TED Hose - Performed by helper: Don/doff right TED hose, Don/doff left TED hose  Lower body assist Assist for lower body dressing: Supervision or verbal cues  Toileting Toileting Toileting activity did not occur: Refused Toileting steps completed by patient: Adjust clothing prior to toileting, Performs perineal hygiene, Adjust clothing after toileting   Toileting Assistive Devices: Grab bar or rail, Prosthesis/orthosis  Toileting assist Assist level: Supervision or verbal cues   Transfers Chair/bed transfer   Chair/bed transfer method: Stand pivot Chair/bed transfer assist level: Touching or steadying assistance (Pt > 75%) Chair/bed transfer assistive device: Walker, IT consultantArmrests      Locomotion Ambulation     Max distance: 55 ft Assist level: Touching or steadying assistance (Pt > 75%)   Wheelchair   Type: Manual Max wheelchair distance: 225 ft Assist Level: Supervision or verbal cues  Cognition Comprehension Comprehension assist level: Understands complex 90% of the time/cues 10% of the time  Expression Expression assist level: Expresses complex ideas: With no assist  Social Interaction Social Interaction assist level: Interacts appropriately with others - No medications needed.  Problem Solving Problem solving assist level: Solves complex 90% of the time/cues < 10% of the time  Memory Memory assist level: Recognizes or recalls 90% of the time/requires cueing < 10% of the time     Medical Problem List and Plan: 1.  Gait abnormality secondary to RLE radiculopathy. 2.  DVT Prophylaxis/Anticoagulation: Pharmaceutical: Lovenox 3. Pain Management:  Managed on oxycodone prn.   Gabapentin increased to 300 TID on 8/14.   4. Mood: LCSW to follow for evaluation and support.  5. Neuropsych: This patient is capable of making decisions on his own behalf. 6. Skin/Wound Care: Monitor wound daily for healing.  7. Fluids/Electrolytes/Nutrition: Monitor I/O.   8. T2DM: Monitor BS ac/hs. Continue glucotrol Xl daily and metformin bid.  9. HTN: Monitor BP bid. Continue cozaar, HCTZ.  10. Constipation: On amitiza bid.    Will wean 11. Leukocytosis  WBCs 10.7 on 8/12  Will cont to monitor 12. ABLA  Hb 11.3 on 8/12  LOS (Days) 3 A FACE TO FACE EVALUATION WAS PERFORMED  Ellorie Kindall Karis Jubanil Jahziel Sinn 06/08/2016 8:50 AM

## 2016-06-08 NOTE — Progress Notes (Signed)
Recreational Therapy Session Note  Patient Details  Name: Jacob Bond MRN: [id number] Date of Birth: 12-11-1955 Today's Date: 06/08/2016  Pt participated in animal assisted activity/therapy bed level with supervision.  [name] 06/08/2016, 2:41 PM

## 2016-06-09 ENCOUNTER — Inpatient Hospital Stay (HOSPITAL_COMMUNITY): Payer: Self-pay | Admitting: Occupational Therapy

## 2016-06-09 ENCOUNTER — Inpatient Hospital Stay (HOSPITAL_COMMUNITY): Payer: Self-pay | Admitting: Physical Therapy

## 2016-06-09 ENCOUNTER — Inpatient Hospital Stay (HOSPITAL_COMMUNITY): Payer: Self-pay

## 2016-06-09 DIAGNOSIS — M4316 Spondylolisthesis, lumbar region: Secondary | ICD-10-CM

## 2016-06-09 DIAGNOSIS — M5410 Radiculopathy, site unspecified: Secondary | ICD-10-CM

## 2016-06-09 DIAGNOSIS — M792 Neuralgia and neuritis, unspecified: Secondary | ICD-10-CM

## 2016-06-09 DIAGNOSIS — D62 Acute posthemorrhagic anemia: Secondary | ICD-10-CM

## 2016-06-09 DIAGNOSIS — R319 Hematuria, unspecified: Secondary | ICD-10-CM

## 2016-06-09 DIAGNOSIS — E119 Type 2 diabetes mellitus without complications: Secondary | ICD-10-CM

## 2016-06-09 LAB — CBC
HCT: 37.2 % — ABNORMAL LOW (ref 39.0–52.0)
HEMOGLOBIN: 12.4 g/dL — AB (ref 13.0–17.0)
MCH: 32.5 pg (ref 26.0–34.0)
MCHC: 33.3 g/dL (ref 30.0–36.0)
MCV: 97.6 fL (ref 78.0–100.0)
PLATELETS: 354 10*3/uL (ref 150–400)
RBC: 3.81 MIL/uL — ABNORMAL LOW (ref 4.22–5.81)
RDW: 11.8 % (ref 11.5–15.5)
WBC: 10.5 10*3/uL (ref 4.0–10.5)

## 2016-06-09 LAB — URINE MICROSCOPIC-ADD ON

## 2016-06-09 LAB — URINALYSIS, ROUTINE W REFLEX MICROSCOPIC
Bilirubin Urine: NEGATIVE
Glucose, UA: NEGATIVE mg/dL
Ketones, ur: NEGATIVE mg/dL
Nitrite: NEGATIVE
Protein, ur: NEGATIVE mg/dL
SPECIFIC GRAVITY, URINE: 1.016 (ref 1.005–1.030)
pH: 7.5 (ref 5.0–8.0)

## 2016-06-09 LAB — GLUCOSE, CAPILLARY
GLUCOSE-CAPILLARY: 169 mg/dL — AB (ref 65–99)
GLUCOSE-CAPILLARY: 115 mg/dL — AB (ref 65–99)
Glucose-Capillary: 120 mg/dL — ABNORMAL HIGH (ref 65–99)
Glucose-Capillary: 94 mg/dL (ref 65–99)

## 2016-06-09 LAB — PSA: PSA: 12.45 ng/mL — AB (ref 0.00–4.00)

## 2016-06-09 MED ORDER — OXYCODONE HCL 5 MG PO TABS
10.0000 mg | ORAL_TABLET | Freq: Two times a day (BID) | ORAL | Status: DC
  Administered 2016-06-09 – 2016-06-13 (×9): 10 mg via ORAL
  Filled 2016-06-09 (×9): qty 2

## 2016-06-09 NOTE — Progress Notes (Signed)
Occupational Therapy Session Note  Patient Details  Name: Jacob Bond MRN: [id number] Date of Birth: 09-08-1956  Today's Date: 06/09/2016 OT Individual Time: 0920-1040 OT Individual Time Calculation (min): 80 min     Short Term Goals:Week 1:  OT Short Term Goal 1 (Week 1): STG=LTG due to LOS  Skilled Therapeutic Interventions/Progress Updates:    Session One: Pt seen for OT ADL bathing/dressing session. Pt sitting up in w/Bond upon arrival, agreeable to tx session. Pt cleared by PA for shower. He ambulated into bathroom with RW and close supervision- CGA. He bathed seated on 3-1 BSC, requiring VCs to remember not to stand without TLSO donned.  He dressed seated on BSC, using lateral leans to pull pants up mod I.  Grooming completed standing at sink with supervision. In ADL apartment, pt completed simple meal prep task, accessing overhead counters and utilizing microwave with supervision. In therapy gym, pt completed sit <> stands from slightly elevated mat completed x4 sets of 3 sit <> stand without UE support. Mat lowered on subsequent trials, completed with supervision. Pt returned to room at end of session, returned to supine and left with all needs in reach. Throughout session, educated regarding OT goals, spinal precautions, and d/Bond planning.    Therapy Documentation Precautions:  Precautions Precautions: Back, Fall Precaution Booklet Issued: Yes (comment) Precaution Comments: Pt aware of all precautions Required Braces or Orthoses: Spinal Brace Spinal Brace: Lumbar corset, Applied in sitting position Restrictions Weight Bearing Restrictions: No Pain: Pain Assessment Pain Assessment: 0-10 Pain Score: 8  Faces Pain Scale: Hurts a little bit Pain Type: Neuropathic pain Pain Location: Leg Pain Orientation: Right;Left Pain Descriptors / Indicators: Tingling Pain Frequency: Intermittent Pain Onset: On-going Patients Stated Pain Goal: 2 Pain Intervention(s): Medication (See  eMAR) ADL: ADL ADL Comments: see Functional Assessment Tools  See Function Navigator for Current Functional Status.   Therapy/Group: Individual Therapy  Jacob Bond 06/09/2016, 7:09 AM

## 2016-06-09 NOTE — Progress Notes (Signed)
PHYSICAL MEDICINE & REHABILITATION     PROGRESS NOTE  Subjective/Complaints:  Pt awoke with bladder discomfort/urgency---experienced two episodes of hematuria. Denies any other symptoms. Still having a lot of back and leg pain especially after he's moving for awhile.   ROS:  +Neuropathic pain. Denies CP, SOB, N/V/D. No insomnia, anxiety,   Objective: Vital Signs: Blood pressure 138/82, pulse 70, temperature 99.1 F (37.3 C), temperature source Oral, resp. rate 18, height 6\' 1"  (1.854 m), weight 85.3 kg (188 lb), SpO2 96 %. No results found.  Recent Labs  06/06/16 0831  WBC 10.7*  HGB 11.3*  HCT 33.7*  PLT 260    Recent Labs  06/06/16 0831  NA 136  K 3.4*  CL 97*  GLUCOSE 154*  BUN 9  CREATININE 1.11  CALCIUM 8.4*   CBG (last 3)   Recent Labs  06/08/16 1642 06/08/16 2120 06/09/16 0651  GLUCAP 110* 97 169*    Wt Readings from Last 3 Encounters:  06/06/16 85.3 kg (188 lb)  06/03/16 85.3 kg (188 lb)  05/26/16 85.6 kg (188 lb 12.8 oz)    Physical Exam:  BP 138/82 (BP Location: Right Arm)   Pulse 70   Temp 99.1 F (37.3 C) (Oral)   Resp 18   Ht 6\' 1"  (1.854 m)   Wt 85.3 kg (188 lb)   SpO2 96%   BMI 24.80 kg/m  Constitutional: He appears well-developed and well-nourished.  HENT: Normocephalic and atraumatic.  Eyes: Conjunctivae and EOM are normal.   Cardiovascular: Normal rate and regular rhythm.   Respiratory: Effort normal and breath sounds normal.  GI: Soft. Bowel sounds are normal.  Neurological: He is alert and oriented.  Speech clear.  Motor: 3-4/5 throughout (pain inhibition) both lower ext. No sensory deficits Skin: Skin is warm and dry. Back honeycomb dressing c/d/i.  Psychiatric: He is slowed.    Assessment/Plan: 1. Functional deficits secondary to RLE radiculopathy which require 3+ hours per day of interdisciplinary therapy in a comprehensive inpatient rehab setting. Physiatrist is providing close team supervision and 24 hour  management of active medical problems listed below. Physiatrist and rehab team continue to assess barriers to discharge/monitor patient progress toward functional and medical goals.  Function:  Bathing Bathing position   Position: Wheelchair/chair at sink  Bathing parts Body parts bathed by patient: Right arm, Left arm, Left lower leg, Chest, Abdomen, Front perineal area, Buttocks, Right upper leg, Right lower leg, Left upper leg    Bathing assist Assist Level: Touching or steadying assistance(Pt > 75%)      Upper Body Dressing/Undressing Upper body dressing   What is the patient wearing?: Pull over shirt/dress     Pull over shirt/dress - Perfomed by patient: Thread/unthread right sleeve, Put head through opening, Pull shirt over trunk, Thread/unthread left sleeve          Upper body assist Assist Level: No help, No cues      Lower Body Dressing/Undressing Lower body dressing   What is the patient wearing?: Underwear, Pants, [employer] Chemicaled Hose Underwear - Performed by patient: Thread/unthread left underwear leg, Pull underwear up/down, Thread/unthread right underwear leg   Pants- Performed by patient: Thread/unthread right pants leg, Thread/unthread left pants leg, Pull pants up/down     Non-skid slipper socks- Performed by helper: Don/doff right sock, Don/doff left sock               TED Hose - Performed by helper: Don/doff right TED hose, Don/doff left TED hose  Lower body assist Assist for lower body dressing: Supervision or verbal cues      Toileting Toileting Toileting activity did not occur: Refused Toileting steps completed by patient: Adjust clothing prior to toileting, Performs perineal hygiene, Adjust clothing after toileting   Toileting Assistive Devices: Grab bar or rail  Toileting assist Assist level: Supervision or verbal cues   Transfers Chair/bed transfer   Chair/bed transfer method: Ambulatory Chair/bed transfer assist level: Touching or steadying  assistance (Pt > 75%) Chair/bed transfer assistive device: Walker, Designer, fashion/clothingArmrests     Locomotion Ambulation     Max distance: 160 Assist level: Touching or steadying assistance (Pt > 75%)   Wheelchair   Type: Manual Max wheelchair distance: 225 ft Assist Level: Supervision or verbal cues  Cognition Comprehension Comprehension assist level: Understands complex 90% of the time/cues 10% of the time  Expression Expression assist level: Expresses complex ideas: With no assist  Social Interaction Social Interaction assist level: Interacts appropriately with others - No medications needed.  Problem Solving Problem solving assist level: Solves complex 90% of the time/cues < 10% of the time  Memory Memory assist level: Recognizes or recalls 90% of the time/requires cueing < 10% of the time     Medical Problem List and Plan: 1.  Gait abnormality secondary to RLE radiculopathy.  -continue CIR therapies  -team conference today 2.  DVT Prophylaxis/Anticoagulation: Pharmaceutical: Lovenox (hold) 3. Pain Management:  Managed on oxycodone prn.   -schedule oxycodone 10mg  daily at 0700 and 1200  Gabapentin increased to 300 TID on 8/14--observe for effect  4. Mood: LCSW to follow for evaluation and support.  5. Neuropsych: This patient is capable of making decisions on his own behalf. 6. Skin/Wound Care: Monitor wound daily for healing.  7. Fluids/Electrolytes/Nutrition: Monitor I/O.   8. T2DM: Monitor BS ac/hs. Continue glucotrol Xl daily and metformin bid.  9. HTN: Monitor BP bid. Continue cozaar, HCTZ.  10. Constipation: On amitiza bid.      wean 11. Leukocytosis  WBCs 10.7 on 8/12  Will cont to monitor 12. ABLA  Hb 11.3 on 8/12 13. Hematuria:  -UA and Cx sent, UA essentially only with blood, bacteria/leukocytes/wbc's rare  -observe for recurrence   -hold lovenox  -check cbc/psa this am  LOS (Days) 4 A FACE TO FACE EVALUATION WAS PERFORMED  Jakaila Norment T 06/09/2016 8:22 AM

## 2016-06-09 NOTE — Progress Notes (Signed)
Occupational Therapy Session Note  Patient Details  Name: Jacob Bond MRN: [id number] Date of Birth: 07-18-1956  Today's Date: 06/09/2016 OT Individual Time: 1430-1500 OT Individual Time Calculation (min): 30 min     Short Term Goals: Week 1:  OT Short Term Goal 1 (Week 1): STG=LTG due to LOS  Skilled Therapeutic Interventions/Progress Updates:    Pt seen for OT session focusing on community mobility activity tolerance and mobility. Pt sitting up in w/c upon arrival, agreeable to tx session. Pt taken off unit to hospital gift store in w/c total A for time and energy conservation. He ambulated through crowded gift store using RW with supervision. Problem solved how to obtain items from low level while maintaining back precautions. Pt hesitant but willing to attempt squat to retrieve item and did so successfully with min A. He then ambulated around gift store without AD and CGA. Pt very hesitant about this and stated he preferred comfort of RW. Discussed OT/PT goals and [facility] Pt returned to room at end of session, returned to supine and left with all needs in reach. Pt educated throughout session regarding therapy goals, energy conservation, fall risk, and d/c planning.    Therapy Documentation Precautions:  Precautions Precautions: Back, Fall Precaution Booklet Issued: Yes (comment) Precaution Comments: Pt aware of all precautions Required Braces or Orthoses: Spinal Brace Spinal Brace: Lumbar corset, Applied in sitting position Restrictions Weight Bearing Restrictions: No Pain: Pain Assessment Pain Assessment: 0-10 Pain Score: 3  Pain Type: Neuropathic pain Pain Location: Leg Pain Orientation: Right;Left Pain Descriptors / Indicators: Tingling Pain Frequency: Intermittent Pain Onset: On-going Patients Stated Pain Goal: 2 Pain Intervention(s): Increased activity;Repositioned Multiple Pain Sites: No ADL: ADL ADL Comments: see  Functional Assessment Tools  See Function Navigator for Current Functional Status.   Therapy/Group: Individual Therapy  [name], Shareena Nusz C 06/09/2016, 3:09 PM

## 2016-06-09 NOTE — Progress Notes (Signed)
Pt voided and noticed blood in urine. Jacob LefevreEunice Bond notified and gave an order for a urine culture and UA. Will monitor. Royston CowperIsley, Tramell Piechota E, RN

## 2016-06-09 NOTE — Progress Notes (Signed)
Patient with continued hematuria with occasional clots.  Updated him on ultrasound results and that H/H stable. May be due to UTI, lovenox or foley trauma. Will d/c lovenox and check string of 5 bottles to monitor for clearing. GU to be contacted in am.

## 2016-06-09 NOTE — Progress Notes (Signed)
Physical Therapy Session Note  Patient Details  Name: Jacob Bond MRN: [id number] Date of Birth: [birth year]  Today's Date: 06/09/2016 PT Individual Time: 0800-0900 and 1300-1400 PT Individual Time Calculation (min): 60 min and 60 min (total 120 min)   Short Term Goals: Week 1:  PT Short Term Goal 1 (Week 1): STG=LTG due to short LOS  Skilled Therapeutic Interventions/Progress Updates:   Tx 1: Pt received supine in bed, c/o pain as below and agreeable to treatment. Supine>sit with mod I. Pt donned lumbar orthosis on EOB with mod I. Ambulatory transfer to w/c with RW and S. W/c propulsion/to from gym mod I. Gait 2x50' with rail in hall, min guard, occasional c/o RLE buckling however no LOB or unsteadiness noted. Gait 2x60' with quad cane and min guard with light UE support on rail initially, no UE support with practice. Standing balance/ tolerance on airex mat while engaged in BUE activity playing dominoes; requires occasional support on RW for balance and 2 seated rest breaks due to increased pain; able to tolerate standing 7-8 min at a time. Returned to room with mod I w/c propulsion as above; remained seated in w/c at end of session, all needs in reach.   Tx 2: Pt received seated on EOB; c/o pain as below and agreeable to treatment. Gait to gym with RW and S x180'; slow speed and min cues for relaxing B shoulders with hiking noted. Stairs 2x12 with B handrails and S. Dynamic standing balance while shooting basketball, including side steps R/L with no AD and close S. Sitting/standing dynamic balance with zoom ball for UE strengthening and internal/external perturbations while balancing. Sit <>stand x10 reps with no UE with S; cues for anterior weight shift to improve ease of transfer and reduce pain. Gait x100' with RW with S, x100' no AD and occasional use of rails, min guard. Remained seated in w/c at end of session, all needs in reach.   Therapy Documentation Precautions:   Precautions Precautions: Back, Fall Precaution Booklet Issued: Yes (comment) Precaution Comments: Pt aware of all precautions Required Braces or Orthoses: Spinal Brace Spinal Brace: Lumbar corset, Applied in sitting position Restrictions Weight Bearing Restrictions: (P) No Pain: Pain Assessment Pain Assessment: 0-10 Pain Score: 3  Pain Type: Neuropathic pain Pain Location: Leg Pain Orientation: Right;Left Pain Descriptors / Indicators: Tingling Pain Frequency: Intermittent Pain Onset: On-going Patients Stated Pain Goal: 2 Pain Intervention(s): Medication (See eMAR);Repositioned Multiple Pain Sites: No   See Function Navigator for Current Functional Status.   Therapy/Group: Individual Therapy  [name] 06/09/2016, 9:50 AM

## 2016-06-10 ENCOUNTER — Inpatient Hospital Stay (HOSPITAL_COMMUNITY): Payer: Self-pay | Admitting: Physical Therapy

## 2016-06-10 ENCOUNTER — Inpatient Hospital Stay (HOSPITAL_COMMUNITY): Payer: Self-pay | Admitting: Occupational Therapy

## 2016-06-10 DIAGNOSIS — R319 Hematuria, unspecified: Secondary | ICD-10-CM

## 2016-06-10 LAB — GLUCOSE, CAPILLARY
GLUCOSE-CAPILLARY: 94 mg/dL (ref 65–99)
GLUCOSE-CAPILLARY: 166 mg/dL — AB (ref 65–99)
Glucose-Capillary: 129 mg/dL — ABNORMAL HIGH (ref 65–99)
Glucose-Capillary: 157 mg/dL — ABNORMAL HIGH (ref 65–99)

## 2016-06-10 LAB — CBC
HEMATOCRIT: 37.8 % — AB (ref 39.0–52.0)
Hemoglobin: 12.5 g/dL — ABNORMAL LOW (ref 13.0–17.0)
MCH: 32.4 pg (ref 26.0–34.0)
MCHC: 33.1 g/dL (ref 30.0–36.0)
MCV: 97.9 fL (ref 78.0–100.0)
Platelets: 402 10*3/uL — ABNORMAL HIGH (ref 150–400)
RBC: 3.86 MIL/uL — AB (ref 4.22–5.81)
RDW: 11.9 % (ref 11.5–15.5)
WBC: 9.3 10*3/uL (ref 4.0–10.5)

## 2016-06-10 LAB — URINE CULTURE: Culture: <10000 — AB

## 2016-06-10 NOTE — Progress Notes (Signed)
Physical Therapy Session Note  Patient Details  Name: Jacob Bond MRN: [id number] Date of Birth: [birth year]  Today's Date: 06/10/2016 PT Individual Time: 1135-1205 PT Individual Time Calculation (min): 30 min    Short Term Goals: Week 1:  PT Short Term Goal 1 (Week 1): STG=LTG due to short LOS  Skilled Therapeutic Interventions/Progress Updates:    Session focused on community and outdoor ambulation to challenge dynamic balance and activity tolerance. Patient ambulated using WBQC < 1000 ft with no rest breaks on/off elevators, over thresholds, throughout hospital, outdoors on brick and concrete surfaces, inclines/declines, and up/down 8 outdoor steps using rail and [facility] with supervision overall and 1 LOB requiring min A to recover. Patient required intermittent cues for 2-point gait pattern and placing cane further away from body to allow RLE room to swing through. Patient left sitting in wheelchair with all needs in reach.   Therapy Documentation Precautions:  Precautions Precautions: Back, Fall Precaution Booklet Issued: Yes (comment) Precaution Comments: Pt aware of all precautions Required Braces or Orthoses: Spinal Brace Spinal Brace: Lumbar corset, Applied in sitting position Restrictions Weight Bearing Restrictions: No Pain: Pain Assessment Pain Assessment: Faces Faces Pain Scale: Hurts a little bit Pain Type: Surgical pain Pain Location: Back Pain Descriptors / Indicators: Aching;Grimacing Pain Onset: With Activity Pain Intervention(s): Emotional support   See Function Navigator for Current Functional Status.   Therapy/Group: Individual Therapy  Kerney ElbeVarner, Keyon Winnick A 06/10/2016, 12:13 PM

## 2016-06-10 NOTE — Consult Note (Signed)
Urology Consult  Referring physician: Jocelyn LamerZ Swartz Reason for referral: Gross hematuria  Chief Complaint: Gross hematuria  History of Present Illness: Recent back surgery and foley catheter; now gross hematuria and clots; fusion lower back on the 9th; lovenox stopped; negative urine c/s; noted varying amounts of blood with urination and small clots intermittently; normally Nx2; Frequency q2-4 hours; no smoking history; no GU history; no UTI history  Has just been told by primary care doctor to [facility]  Modifying factors: There are no other modifying factors  Associated signs and symptoms: There are no other associated signs and symptoms Aggravating and relieving factors: There are no other aggravating or relieving factors Severity: Mild to moderate Duration: Persistent but not botheromes     Past Medical History:  Diagnosis Date  . Asthma    child  . Back pain   . Diabetes mellitus without complication (HCC)   . Hypertension    Past Surgical History:  Procedure Laterality Date  . KNEE ARTHROSCOPY Right   . TRANSFORAMINAL LUMBAR INTERBODY FUSION (TLIF) WITH PEDICLE SCREW FIXATION 1 LEVEL N/A 06/03/2016   Procedure: TRANSFORAMINAL LUMBAR INTERBODY FUSION (TLIF) WITH PEDICLE SCREW FIXATION 1 LEVEL;  Surgeon: Venita Lickahari Brooks, MD;  Location: [facility] OR;  Service: Orthopedics;  Laterality: N/A;    Medications: I have reviewed the patient's current medications. Allergies:  Allergies  Allergen Reactions  . Other     Tide Soap    No family history on file. Social History:  reports that he has never smoked. He has never used smokeless tobacco. He reports that he drinks alcohol. He reports that he does not use drugs.  ROS: All systems are reviewed and negative except as noted. Rest negative  Physical Exam:  Vital signs in last 24 hours: Temp:  [98.1 F (36.7 C)-98.9 F (37.2 C)] 98.1 F (36.7 C) (08/16 0524) Pulse Rate:  [79-106] 79 (08/16  0524) Resp:  [18-20] 18 (08/16 0524) BP: (114-123)/(79-85) 123/85 (08/16 0524) SpO2:  [96 %-100 %] 100 % (08/16 0524)  Cardiovascular: Skin warm; not flushed Respiratory: Breaths quiet; no shortness of breath Abdomen: No masses Neurological: Normal sensation to touch Musculoskeletal: Normal motor function arms and legs Lymphatics: No inguinal adenopathy Skin: No rashes Genitourinary:male genitalia normal; deferred rectal exam in chair and f/up with urology  Laboratory Data:  Results for orders placed or performed during the hospital encounter of 06/05/16 (from the past 72 hour(s))  Glucose, capillary     Status: Abnormal   Collection Time: 06/07/16  4:59 PM  Result Value Ref Range   Glucose-Capillary 110 (H) 65 - 99 mg/dL  Glucose, capillary     Status: Abnormal   Collection Time: 06/07/16  8:54 PM  Result Value Ref Range   Glucose-Capillary 131 (H) 65 - 99 mg/dL  Glucose, capillary     Status: Abnormal   Collection Time: 06/08/16  6:31 AM  Result Value Ref Range   Glucose-Capillary 172 (H) 65 - 99 mg/dL  Glucose, capillary     Status: None   Collection Time: 06/08/16 11:36 AM  Result Value Ref Range   Glucose-Capillary 75 65 - 99 mg/dL   Comment 1 Notify RN   Glucose, capillary     Status: Abnormal   Collection Time: 06/08/16  4:42 PM  Result Value Ref Range   Glucose-Capillary 110 (H) 65 - 99 mg/dL  Glucose, capillary     Status: None   Collection Time: 06/08/16  9:20 PM  Result  Value Ref Range   Glucose-Capillary 97 65 - 99 mg/dL  Glucose, capillary     Status: Abnormal   Collection Time: 06/09/16  6:51 AM  Result Value Ref Range   Glucose-Capillary 169 (H) 65 - 99 mg/dL  Urinalysis, Routine w reflex microscopic (not at [facility])     Status: Abnormal   Collection Time: 06/09/16  7:10 AM  Result Value Ref Range   Color, Urine RED (A) YELLOW    Comment: BIOCHEMICALS MAY BE AFFECTED BY COLOR   APPearance CLOUDY (A) CLEAR   Specific Gravity, Urine 1.016 1.005 - 1.030    pH 7.5 5.0 - 8.0   Glucose, UA NEGATIVE NEGATIVE mg/dL   Hgb urine dipstick LARGE (A) NEGATIVE   Bilirubin Urine NEGATIVE NEGATIVE   Ketones, ur NEGATIVE NEGATIVE mg/dL   Protein, ur NEGATIVE NEGATIVE mg/dL   Nitrite NEGATIVE NEGATIVE   Leukocytes, UA TRACE (A) NEGATIVE  Culture, Urine     Status: Abnormal   Collection Time: 06/09/16  7:10 AM  Result Value Ref Range   Specimen Description URINE, RANDOM    Special Requests NONE    Culture <10,000 COLONIES/mL INSIGNIFICANT GROWTH (A)    Report Status 06/10/2016 FINAL   Urine microscopic-add on     Status: Abnormal   Collection Time: 06/09/16  7:10 AM  Result Value Ref Range   Squamous Epithelial / LPF 0-5 (A) NONE SEEN   WBC, UA 0-5 0 - 5 WBC/hpf   RBC / HPF TOO NUMEROUS TO COUNT 0 - 5 RBC/hpf   Bacteria, UA RARE (A) NONE SEEN  Glucose, capillary     Status: Abnormal   Collection Time: 06/09/16 11:46 AM  Result Value Ref Range   Glucose-Capillary 115 (H) 65 - 99 mg/dL  CBC     Status: Abnormal   Collection Time: 06/09/16 12:25 PM  Result Value Ref Range   WBC 10.5 4.0 - 10.5 K/uL   RBC 3.81 (L) 4.22 - 5.81 MIL/uL   Hemoglobin 12.4 (L) 13.0 - 17.0 g/dL   HCT 40.937.2 (L) 81.139.0 - 91.452.0 %   MCV 97.6 78.0 - 100.0 fL   MCH 32.5 26.0 - 34.0 pg   MCHC 33.3 30.0 - 36.0 g/dL   RDW 78.211.8 95.611.5 - 21.315.5 %   Platelets 354 150 - 400 K/uL  PSA     Status: Abnormal   Collection Time: 06/09/16 12:25 PM  Result Value Ref Range   PSA 12.45 (H) 0.00 - 4.00 ng/mL    Comment: (NOTE) While PSA levels of <=4.0 ng/ml are reported as reference range, some men with levels below 4.0 ng/ml can have prostate cancer and many men with PSA above 4.0 ng/ml do not have prostate cancer.  Other tests such as free PSA, age specific reference ranges, PSA velocity and PSA doubling time may be helpful especially in men less than 60 years old.   Glucose, capillary     Status: Abnormal   Collection Time: 06/09/16  4:21 PM  Result Value Ref Range   Glucose-Capillary  120 (H) 65 - 99 mg/dL  Glucose, capillary     Status: None   Collection Time: 06/09/16  8:53 PM  Result Value Ref Range   Glucose-Capillary 94 65 - 99 mg/dL  Glucose, capillary     Status: Abnormal   Collection Time: 06/10/16  6:46 AM  Result Value Ref Range   Glucose-Capillary 129 (H) 65 - 99 mg/dL  CBC     Status: Abnormal   Collection Time: 06/10/16  10:58 AM  Result Value Ref Range   WBC 9.3 4.0 - 10.5 K/uL   RBC 3.86 (L) 4.22 - 5.81 MIL/uL   Hemoglobin 12.5 (L) 13.0 - 17.0 g/dL   HCT 16.1 (L) 09.6 - 04.5 %   MCV 97.9 78.0 - 100.0 fL   MCH 32.4 26.0 - 34.0 pg   MCHC 33.1 30.0 - 36.0 g/dL   RDW 40.9 81.1 - 91.4 %   Platelets 402 (H) 150 - 400 K/uL  Glucose, capillary     Status: None   Collection Time: 06/10/16 11:24 AM  Result Value Ref Range   Glucose-Capillary 94 65 - 99 mg/dL   Comment 1 Notify RN    Recent Results (from the past 240 hour(s))  Urine culture     Status: Abnormal   Collection Time: 06/06/16  7:32 AM  Result Value Ref Range Status   Specimen Description URINE, CLEAN CATCH  Final   Special Requests NONE  Final   Culture 3,000 COLONIES/mL INSIGNIFICANT GROWTH (A)  Final   Report Status 06/07/2016 FINAL  Final  Culture, Urine     Status: Abnormal   Collection Time: 06/09/16  7:10 AM  Result Value Ref Range Status   Specimen Description URINE, RANDOM  Final   Special Requests NONE  Final   Culture <10,000 COLONIES/mL INSIGNIFICANT GROWTH (A)  Final   Report Status 06/10/2016 FINAL  Final   Creatinine:  Recent Labs  06/06/16 0831  CREATININE 1.11    Xrays: See report/chart Renal u/ssound noted large prostate/no bladder clot/rt renal cyst  Impression/Assessment:  Gross hematuria likely from VERY large prostate noted on u/sound  Plan:  Push fluids; Patient to f/up with [facility]rology at [facility] for gross hematura; elevated PSA; will need cystoscopy and possible CT scan  Aileana Hodder A 06/10/2016, 12:22 PM

## 2016-06-10 NOTE — Progress Notes (Deleted)
Occupational Therapy Session Note  Patient Details  Name: Jacob Bond MRN: [id number] Date of Birth: 12/05/1955  Today's Date: 06/10/2016 OT Individual Time: 1000-1100 and 1300-1345 OT Individual Time Calculation (min): 60 min and 45 min    Short Term Goals: Week 1:  OT Short Term Goal 1 (Week 1): STG=LTG due to LOS  Skilled Therapeutic Interventions/Progress Updates:    Session One: Pt seen for skilled OT session focusing on self care and functional mobility. Pt sitting in w/c upon arrival and agreeable to tx session. Pt ambulated to bathroom with hand held assist to complete shower with setup/supervision. OT educated pt on performing lateral leans due to back precautions pt able to recall this info from previous session. Pt ambulated with hand held assist around room to gather clothing items needing increased encourgament from OT to ambulate to laundry room. Pt required several rest breaks, but was able to ambulate to laundry using quad cane and load washer with CGA for balance. OT propelled pt back to room for energy conservation and left pt sitting in w/c with all needs met.   Session Two: Pt seen for skilled OT session focusing on functional mobility and activity tolerance. Pt sitting in w/c and agreeable to tx session upon arrival. Pt ambulated throughout session using quad cane and CGA. Pt ambulated to laundry room and folded clothes in standing needing VC for back precautions during functional task. Pt alternated between self propelling w/c and ambulated using quad cane to and from solarium. Pt practiced getting up from high and low surfaces to increase BLE strength. OT provided VC for "nose over toes" technique and VC to prevent arching back when sitting in soft chairs. Pt then ambulated to gym to work on increasing UB strength by using #1.5 weights on arms to screw bolts into bolt tower. Pt required VC to take rest breaks and education on energy conservation strategies. Pt ambulated to  room and used restroom with supervision. Pt left sitting EOB with all needs met.   Therapy Documentation Precautions:  Precautions Precautions: Back, Fall Precaution Booklet Issued: Yes (comment) Precaution Comments: Pt aware of all precautions Required Braces or Orthoses: Spinal Brace Spinal Brace: Lumbar corset, Applied in sitting position Restrictions Weight Bearing Restrictions: No Pain: Pain Assessment Faces Pain Scale: Hurts a little bit ADL: ADL ADL Comments: see Functional Assessment Tools  See Function Navigator for Current Functional Status.   Therapy/Group: Individual Therapy  Hannah Helms 06/10/2016, 10:13 AM  

## 2016-06-10 NOTE — Progress Notes (Signed)
Patient information reviewed and entered into [facility]hab system by Sophya Vanblarcom, RN, CRRN, PPS Coordinator.  Information including medical coding and functional independence measure will be reviewed and updated through discharge.    

## 2016-06-10 NOTE — Progress Notes (Signed)
[place] PHYSICAL MEDICINE & REHABILITATION     PROGRESS NOTE  Subjective/Complaints:  Persistent hematuria with clot/blood at end of stream. Mild hypogastric discomfort at rest and with void  ROS:  +Neuropathic pain. Denies CP, SOB, N/V/D. No insomnia, anxiety,   Objective: Vital Signs: Blood pressure 123/85, pulse 79, temperature 98.1 F (36.7 C), temperature source Oral, resp. rate 18, height 6\' 1"  (1.854 m), weight 85.3 kg (188 lb), SpO2 100 %. [place] Renal  Result Date: 06/09/2016 CLINICAL DATA:  Hematuria EXAM: RENAL / URINARY TRACT ULTRASOUND COMPLETE COMPARISON:  CT of the lumbar spine May 23, 2016 FINDINGS: Right Kidney: Length: 11.1 cm. There is a 3.7 x 3.7 x 3.4 cm mass in the lower right kidney which is anechoic with increased through transmission. The attenuation in this mass on a recent CT lumbar spine is less than 10 Hounsfield units with multiple measurements. The right kidney is otherwise normal. Left Kidney: Length: 11.5 cm. Echogenicity within normal limits. No mass or hydronephrosis visualized. Bladder: Appears normal for degree of bladder distention. The prostate is enlarged measuring 6.6 x 5.3 x 5.3 cm IMPRESSION: 1. The mass in the lower right kidney is consistent with a cyst based on ultrasound and CT imaging. 2. Prostate enlargement. 3. A definitive cause for hematuria is not seen. CT and MRI are both much more sensitive and specific for causes of hematuria and could be performed if concern persists. Electronically Signed   By: Gerome Samavid  Williams III M.D   On: 06/09/2016 13:58    Recent Labs  06/09/16 1225  WBC 10.5  HGB 12.4*  HCT 37.2*  PLT 354   No results for input(s): NA, K, CL, GLUCOSE, BUN, CREATININE, CALCIUM in the last 72 hours.  Invalid input(s): CO CBG (last 3)   Recent Labs  06/09/16 1621 06/09/16 2053 06/10/16 0646  GLUCAP 120* 94 129*    Wt Readings from Last 3 Encounters:  06/06/16 85.3 kg (188 lb)  06/03/16 85.3 kg (188 lb)  05/26/16 85.6  kg (188 lb 12.8 oz)    Physical Exam:  BP 123/85 (BP Location: Left Arm)   Pulse 79   Temp 98.1 F (36.7 C) (Oral)   Resp 18   Ht 6\' 1"  (1.854 m)   Wt 85.3 kg (188 lb)   SpO2 100%   BMI 24.80 kg/m  Constitutional: He appears well-developed and well-nourished.  HENT: Normocephalic and atraumatic.  Eyes: Conjunctivae and EOM are normal.   Cardiovascular: Normal rate and regular rhythm.   Respiratory: Effort normal and breath sounds normal.  GI: Soft. Bowel sounds are normal.  Neurological: He is alert and oriented.  Speech clear.  Motor: 3+ to 4/5 throughout (pain inhibition) both lower ext. No sensory deficits Skin: Skin is warm and dry. Back wound c/d/i.  Psychiatric: He is appropriate    Assessment/Plan: 1. Functional deficits secondary to RLE radiculopathy which require 3+ hours per day of interdisciplinary therapy in a comprehensive inpatient rehab setting. Physiatrist is providing close team supervision and 24 hour management of active medical problems listed below. Physiatrist and rehab team continue to assess barriers to discharge/monitor patient progress toward functional and medical goals.  Function:  Bathing Bathing position   Position: Shower  Bathing parts Body parts bathed by patient: Right arm, Left arm, Left lower leg, Chest, Abdomen, Front perineal area, Buttocks, Right upper leg, Right lower leg, Left upper leg, Back    Bathing assist Assist Level: Supervision or verbal cues, Set up   Set up :  To obtain items  Upper Body Dressing/Undressing Upper body dressing   What is the patient wearing?: Pull over shirt/dress, Orthosis     Pull over shirt/dress - Perfomed by patient: Thread/unthread right sleeve, Put head through opening, Pull shirt over trunk, Thread/unthread left sleeve       Orthosis activity level: Performed by patient  Upper body assist Assist Level: Set up   Set up : To obtain clothing/put away  Lower Body Dressing/Undressing Lower body  dressing   What is the patient wearing?: Underwear, Pants, Ted Hose, Non-skid slipper socks Underwear - Performed by patient: Thread/unthread left underwear leg, Pull underwear up/down, Thread/unthread right underwear leg   Pants- Performed by patient: Thread/unthread right pants leg, Thread/unthread left pants leg, Pull pants up/down     Non-skid slipper socks- Performed by helper: Don/doff right sock, Don/doff left sock             TED Hose - Performed by patient: Don/doff right TED hose, Don/doff left TED hose TED Hose - Performed by helper: Don/doff right TED hose, Don/doff left TED hose  Lower body assist Assist for lower body dressing: More than reasonable time      Toileting Toileting Toileting activity did not occur: Refused Toileting steps completed by patient: Adjust clothing prior to toileting, Performs perineal hygiene, Adjust clothing after toileting   Toileting Assistive Devices: Grab bar or rail  Toileting assist Assist level: Supervision or verbal cues   Transfers Chair/bed transfer   Chair/bed transfer method: Ambulatory Chair/bed transfer assist level: Supervision or verbal cues Chair/bed transfer assistive device: Environmental consultantWalker, Designer, fashion/clothingArmrests     Locomotion Ambulation     Max distance: 180 Assist level: Supervision or verbal cues   Wheelchair   Type: Manual Max wheelchair distance: 225 ft Assist Level: Supervision or verbal cues  Cognition Comprehension Comprehension assist level: Understands complex 90% of the time/cues 10% of the time  Expression Expression assist level: Expresses complex 90% of the time/cues < 10% of the time  Social Interaction Social Interaction assist level: Interacts appropriately 90% of the time - Needs monitoring or encouragement for participation or interaction.  Problem Solving Problem solving assist level: Solves complex 90% of the time/cues < 10% of the time  Memory Memory assist level: Complete Independence: No helper      Medical Problem List and Plan: 1.  Gait abnormality secondary to RLE radiculopathy.  -continue CIR therapies  -progressing toward goals  -working on dispo 2.  DVT Prophylaxis/Anticoagulation: Pharmaceutical: Lovenox (hold) 3. Pain Management:  Managed on oxycodone prn.   -schedule oxycodone 10mg  daily at 0700 and 1200  Gabapentin increased to 300 TID on 8/14---increase to 400mg  today  4. Mood: LCSW to follow for evaluation and support.  5. Neuropsych: This patient is capable of making decisions on his own behalf. 6. Skin/Wound Care: Monitor wound daily for healing.  7. Fluids/Electrolytes/Nutrition: Monitor I/O.   8. T2DM: Monitor BS ac/hs. Continue glucotrol Xl daily and metformin bid.  9. HTN: Monitor BP bid. Continue cozaar, HCTZ.  10. Constipation: On amitiza bid.      wean 11. Leukocytosis  WBCs 10.5 yesterday  Will cont to monitor 12. ABLA  Hb 12.4 yesterday 13. Hematuria:  -persistent cranberry colored urine with blood/clot at end of stream. Mild discomfort  -ua with blood, ucx pending  -PSA 12  -cyst/mass 3x3x3cm lower right kidney  -lovenox stopped  -labs stable  -urology consult requested LOS (Days) 5 A FACE TO FACE EVALUATION WAS PERFORMED  Kharis Lapenna T 06/10/2016 8:41  AM

## 2016-06-10 NOTE — Patient Care Conference (Signed)
Inpatient RehabilitationTeam Conference and Plan of Care Update Date: 06/09/2016   Time: 2:15 PM    Patient Name: Jacob Bond      Medical Record Number: [id number]  Date of Birth: 18-Feb-1956 Sex: Male         Room/Bed: 4W01C/4W01C-01 Payor Info: Payor: /    Admitting Diagnosis: L4-5 Fusion  Admit Date/Time:  06/05/2016  4:41 PM Admission Comments: No comment available   Primary Diagnosis:  <principal problem not specified> Principal Problem: <principal problem not specified>  Patient Active Problem List   Diagnosis Date Noted  . Hematuria 06/09/2016  . Neuropathic pain   . Radicular low back pain   . Leukocytosis   . Acute blood loss anemia   . Type 2 diabetes mellitus without complication, without long-term current use of insulin (HCC)   . Spondylolisthesis of lumbar region 06/05/2016  . S/P lumbar fusion   . Surgery, elective   . Benign essential HTN   . Diabetes mellitus type 2 in nonobese (HCC)   . Chronic back pain 06/03/2016    Expected Discharge Date: Expected Discharge Date: 06/13/16  Team Members Present: Physician leading conference: Dr. Faith RogueZachary Swartz Social Worker Present: Staci AcostaJenny Kaysan Peixoto, LCSW Nurse Present: Carmie EndAngie Joyce, RN PT Present: Bayard Huggerebecca Varner, Marcene BrawnPT;Elizabeth Tygielski, PT OT Present: Roney MansJennifer Smith, Lorane GellT;Amy [name], OT SLP Present: Feliberto Gottronourtney Payne, SLP PPS Coordinator present : Tora DuckMarie Noel, RN, CRRN     Current Status/Progress Goal Weekly Team Focus  Medical   continued low back pain with radiation to legs. developed hematuria this AM--work up underway  improve activity tolerance and pain control  pain mgt, urological work up   Bowel/Bladder   cont x2. LBM: 8/14  remain cont x2  continue with plan of care   Swallow/Nutrition/ Hydration             ADL's   CGA overall  Mod I overall with supervision for shower transfers and IADLs  ADL re-training with AE, Activity tolerance, sit <> stands, d/c planning   Mobility   min guard gait with RW and  stairs, S transfers and bed mobility  modI overall, S stairs and community ambulation  LE strengthening, gait training, dynamic standing balance   Communication             Safety/Cognition/ Behavioral Observations            Pain   back pain and neuropathy to lower extremities. Worse with activity. PRN medications effective.   <2  assess and treat qshift and PRN    Skin   Honeycomb to lower back x2. has needed reinforcement due to movement making it loose. Skin otherwise CDI   no further breakdown/ infection while on rehab  change dressing PRN and monitor for any changes in skin condition     Rehab Goals Patient on target to meet rehab goals: Yes Rehab Goals Revised: none *See Care Plan and progress notes for long and short-term goals.  Barriers to Discharge: pain/medical issues as above    Possible Resolutions to Barriers:  rx of above. pt working through pain/scheduled oxycodone prior to therapy    Discharge Planning/Teaching Needs:  Pt does not have a local caregiver.  Per pt's workers' comp Sports coachcase manager, pt's neurosurgeon, Dr. Shon BatonBrooks, has stated that pt cannot go home alone and to the hotel.  CSW and workers' comp Sports coachcase manager will look into other alternatives for pt, including Assisted Living.  Pt willl be able to direct his care at the next venue.  Team Discussion: Pt with hematuria and Dr. Riley KillSwartz has called in Urology to assess.  Pt is doing well and therapy and making progress. Has some supervision level goals, so will need assistance after d/c.    Revisions to Treatment Plan:  None   Continued Need for Acute Rehabilitation Level of Care: The patient requires daily medical management by a physician with specialized training in physical medicine and rehabilitation for the following conditions: Daily direction of a multidisciplinary physical rehabilitation program to ensure safe treatment while eliciting the highest outcome that is of practical value to the patient.:  Yes Daily medical management of patient stability for increased activity during participation in an intensive rehabilitation regime.: Yes Daily analysis of laboratory values and/or radiology reports with any subsequent need for medication adjustment of medical intervention for : Neurological problems;Post surgical problems;Urological problems  Inocente Krach, Vista DeckJennifer Capps 06/10/2016, 10:34 AM

## 2016-06-10 NOTE — Progress Notes (Signed)
Physical Therapy Session Note  Patient Details  Name: Jacob Bond MRN: [id number] Date of Birth: 1956/01/27  Today's Date: 06/10/2016 PT Individual Time: 0902-0945 PT Individual Time Calculation (min): 43 min    Short Term Goals: Week 1:  PT Short Term Goal 1 (Week 1): STG=LTG due to short LOS  Skilled Therapeutic Interventions/Progress Updates:    Pt received in w/c & agreeable to PT, noting 5/10 back pain but reports being premedicated. Pt propelled w/c x 200 ft room>gym with BUE & supervision for cardiovascular endurance training. Pt ambulated 150 ft + 250 ft with RW, superversion, and cuing for forward gaze for endurance training. Pt completed 10 repetitions of sit<>stand without BUE support for BLE strengthening & reported increased pain in buttocks during task. Pt stood on compliant surface x 4 minutes + 2 minutes without BUE support while playing connect four to focus on standing balance & endurance; pt reported "I feel it in my thighs" and "I'm balancing" during task. Pt propelled w/c x 200 ft back to room & left in w/c with all needs within reach.   Therapy Documentation Precautions:  Precautions Precautions: Back, Fall Precaution Booklet Issued: Yes (comment) Precaution Comments: Pt aware of all precautions Required Braces or Orthoses: Spinal Brace Spinal Brace: Lumbar corset, Applied in sitting position Restrictions Weight Bearing Restrictions: No   See Function Navigator for Current Functional Status.   Therapy/Group: Individual Therapy  Sandi MariscalVictoria M Skylah Bond 06/10/2016, 12:40 PM

## 2016-06-10 NOTE — Progress Notes (Signed)
Occupational Therapy Session Note  Patient Details  Name: Jacob Bond MRN: [id number] Date of Birth: Jul 27, 1956  Today's Date: 06/10/2016 OT Individual Time: 1000-1100 and 1300-1345 OT Individual Time Calculation (min): 60 min and 45 min     Short Term Goals:Week 1:  OT Short Term Goal 1 (Week 1): STG=LTG due to LOS  Skilled Therapeutic Interventions/Progress Updates:    Session One: Pt seen for skilled OT session focusing on self care and functional mobility. Pt sitting in w/c upon arrival and agreeable to tx session. Pt ambulated to bathroom with hand held assist to complete shower with setup/supervision. OT educated pt on performing lateral leans due to back precautions pt able to recall this info from previous session. Pt ambulated with hand held assist around room to gather clothing items needing increased encourgament from OT to ambulate to laundry room. Pt required several rest breaks, but was able to ambulate to laundry using quad cane and load washer with CGA for balance. OT propelled pt back to room for energy conservation and left pt sitting in w/c with all needs met.   Session Two: Pt seen for skilled OT session focusing on functional mobility and activity tolerance. Pt sitting in w/c and agreeable to tx session upon arrival. Pt ambulated throughout session using quad cane and CGA. Pt ambulated to laundry room and folded clothes in standing needing VC for back precautions during functional task. Pt alternated between self propelling w/c and ambulated using quad cane to and from solarium. Pt practiced getting up from high and low surfaces to increase BLE strength. OT provided VC for "nose over toes" technique and VC to prevent arching back when sitting in soft chairs. Pt then ambulated to gym to work on increasing UB strength by using #1.5 weights on arms to screw bolts into bolt tower. Pt required VC to take rest breaks and education on energy conservation strategies. Pt ambulated to  room and used restroom with supervision. Pt left sitting EOB with all needs met.   Therapy Documentation Precautions:  Precautions Precautions: Back, Fall Precaution Booklet Issued: Yes (comment) Precaution Comments: Pt aware of all precautions Required Braces or Orthoses: Spinal Brace Spinal Brace: Lumbar corset, Applied in sitting position Restrictions Weight Bearing Restrictions: No Pain: Denies pain   See Function Navigator for Current Functional Status.   Therapy/Group: Individual Therapy  Matilde Bash 06/10/2016, 3:40 PM

## 2016-06-11 ENCOUNTER — Inpatient Hospital Stay (HOSPITAL_COMMUNITY): Payer: Self-pay | Admitting: Occupational Therapy

## 2016-06-11 ENCOUNTER — Inpatient Hospital Stay (HOSPITAL_COMMUNITY): Payer: Self-pay

## 2016-06-11 ENCOUNTER — Inpatient Hospital Stay (HOSPITAL_COMMUNITY): Payer: Self-pay | Admitting: Physical Therapy

## 2016-06-11 LAB — GLUCOSE, CAPILLARY
GLUCOSE-CAPILLARY: 108 mg/dL — AB (ref 65–99)
GLUCOSE-CAPILLARY: 181 mg/dL — AB (ref 65–99)
Glucose-Capillary: 116 mg/dL — ABNORMAL HIGH (ref 65–99)

## 2016-06-11 MED ORDER — METHOCARBAMOL 750 MG PO TABS: 750.0000 mg | ORAL_TABLET | Freq: Four times a day (QID) | ORAL | Status: DC | PRN

## 2016-06-11 MED ORDER — TUBERCULIN PPD 5 UNIT/0.1ML ID SOLN
5.0000 [IU] | Freq: Once | INTRADERMAL | Status: DC
  Administered 2016-06-11: 5 [IU] via INTRADERMAL
  Filled 2016-06-11: qty 0.1

## 2016-06-11 NOTE — Progress Notes (Signed)
Physical Therapy Session Note  Patient Details  Name: Jacob Bond MRN: [id number] Date of Birth: [birth year]  Today's Date: 06/11/2016 PT Individual Time: 0900-1000 PT Individual Time Calculation (min): 60 min    Short Term Goals: Week 1:  PT Short Term Goal 1 (Week 1): STG=LTG due to short LOS  Skilled Therapeutic Interventions/Progress Updates:   Pt received seated in recliner, 6.5/10 in low back and agreeable to treatment. Gait to gym with quad cane and close S. Stairs x24 stairs total with pt progressed BUE support, 1 UE support, and no UE support for final two trials. S for two UE and one UE, min guard for no UE. Biodex on static/dynamic surface while playing catch game for focus on weight shifting, ankle strategy. Supine PROM to BLE hamstrings and hip internal/external rotators with ROM limitations noted in B hamstrings R>L. Educated pt in seated hamstring stretch with proper body mechanics to avoid bending in lumbar spine; performed BLE 2x1 min. Sidelying hip clamshell and straight leg raise for glute med strengthening, carryover into dynamic balance and SLS. Side stepping R/L and braiding R/L with UE support initially S, progressed to no UE support min guard. Gait to return to room with no AD and close S. Remained seated in w/c at end of session, all needs in reach.   Therapy Documentation Precautions:  Precautions Precautions: Back, Fall Precaution Booklet Issued: Yes (comment) Precaution Comments: Pt aware of all precautions Required Braces or Orthoses: Spinal Brace Spinal Brace: Lumbar corset, Applied in sitting position Restrictions Weight Bearing Restrictions: No Pain: Pain Assessment Faces Pain Scale: Hurts a little bit   See Function Navigator for Current Functional Status.   Therapy/Group: Individual Therapy  [name] 06/11/2016, 9:42 AM

## 2016-06-11 NOTE — Progress Notes (Signed)
Occupational Therapy Session Note  Patient Details  Name: Jacob Bond MRN: [id number] Date of Birth: Jan 02, 1956  Today's Date: 06/11/2016 OT Individual Time: 4128-7867 OT Individual Time Calculation (min): 43 min     Short Term Goals:Week 1:  OT Short Term Goal 1 (Week 1): STG=LTG due to LOS  Skilled Therapeutic Interventions/Progress Updates:    Pt seen for skilled OT session focusing on self care. Pt sitting in recliner upon arrival and agreeable to tx session. Pt ambulated throughout session using quad cane and CGA due to decreased balance when fatigued. After demonstration and encouragement from OT in gym pt practiced floor transfers x2. Pt required VC positioning and CGA to stand from floor each trial. OT educated pt on when to perform floor transfer and when to call 911. Pt then practiced picking up items on low surfaces working on bending at knees to pick them up needing increased assist from therapist due to balance. Pt ambulated into rehab bathroom and after demonstration from OT practiced stepping over tub with CGA for balance. OT educated on using shower chair at d/c due to back precautions with pt verbalizing understanding and deciding to use shower chair with a back. Pt ambulated to kitchen and practiced kitchen mobility using quad cane. Pt them returned to room and left sitting in recliner withwith SNF representative present and all needs met. Throughout session, pt stated he felt much more comfortable with transfers and his dynamic sitting/standing balance during functional activities. Pt thanked therapist for working/pushing him and feels rehab has been a tremendous help.    Therapy Documentation Precautions:  Precautions Precautions: Back, Fall Precaution Booklet Issued: Yes (comment) Precaution Comments: Pt aware of all precautions Required Braces or Orthoses: Spinal Brace Spinal Brace: Lumbar corset, Applied in sitting position Restrictions Weight Bearing Restrictions:  No Pain:  Pt denies pain  See Function Navigator for Current Functional Status.   Therapy/Group: Individual Therapy  Matilde Bash 06/11/2016, 2:30 PM

## 2016-06-11 NOTE — Progress Notes (Signed)
Occupational Therapy Session Note  Patient Details  Name: Jacob Bond MRN: [id number] Date of Birth: [birth year]  Today's Date: 06/11/2016 OT Individual Time: 5015-8682 OT Individual Time Calculation (min): 45 min    Skilled Therapeutic Interventions/Progress Updates:    Pt seen for skilled OT session focusing on functional mobility, activity tolerance and dynamic standing balance. Pt sitting in w/c upon arrival and agreeable to tx session. Pt ambulated throughout session alternating using quad cane and walking with CGA. Pt ambulated to gym and practiced throwing ball onto trampoline standing on wedge to increase dynamic standing balance. Pt then practiced shooting basketball from different locations and ambulated with CGA to get ball. Pt fearful of falling and OT educated on fall prevention and techniques if fall occurs. Pt agreeable to trying floor transfer in future session. Pt ambulated back to room and left sitting EOB with all needs met.   Therapy Documentation Precautions:  Precautions Precautions: Back, Fall Precaution Booklet Issued: Yes (comment) Precaution Comments: Pt aware of all precautions Required Braces or Orthoses: Spinal Brace Spinal Brace: Lumbar corset, Applied in sitting position Restrictions Weight Bearing Restrictions: No Pain:  Pt denies pain   See Function Navigator for Current Functional Status.   Therapy/Group: Individual Therapy  Matilde Bash 06/11/2016, 12:04 PM

## 2016-06-11 NOTE — Plan of Care (Signed)
Problem: RH Tub/Shower Transfers Goal: LTG Patient will perform tub/shower transfers w/assist (OT) LTG: Patient will perform tub/shower transfers with assist, with/without cues using equipment (OT)  Outcome: Not Applicable Date Met: 66/66/48 Goal d/c due to change in d/c disposition. - AL
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
## 2016-06-11 NOTE — NC FL2 (Signed)
[place] MEDICAID FL2 LEVEL OF CARE SCREENING TOOL     IDENTIFICATION  Patient Name: Jacob Bond Birthdate: [birth year] Sex: male Admission Date (Current Location): 06/05/2016  [facility] [place]  [place]   Facility and Address:  [facility], [address], [place], [place] [address]      Provider Number: [id number]  Attending Physician Name and Address:  Ranelle OysterZachary T Swartz, MD  Relative Name and Phone Number:       Current Level of Care: Other (Comment) (Acute Inpatient Rehabilitation) Recommended Level of Care: Assisted Living Facility Prior Approval Number:    Date Approved/Denied:   PASRR Number: [id number]  Discharge Plan: Other (Comment) (Assisted living)    Current Diagnoses: Patient Active Problem List   Diagnosis Date Noted  . Hematuria 06/09/2016  . Neuropathic pain   . Radicular low back pain   . Leukocytosis   . Acute blood loss anemia   . Type 2 diabetes mellitus without complication, without long-term current use of insulin (HCC)   . Spondylolisthesis of lumbar region 06/05/2016  . S/P lumbar fusion   . Surgery, elective   . Benign essential HTN   . Diabetes mellitus type 2 in nonobese (HCC)   . Chronic back pain 06/03/2016    Orientation RESPIRATION BLADDER Height & Weight     Self, Time, Situation, Place  Normal Continent Weight: 85.3 kg (188 lb) Height:  6\' 1"  (185.4 cm)  BEHAVIORAL SYMPTOMS/MOOD NEUROLOGICAL BOWEL NUTRITION STATUS      Continent Diet (carb modified)  AMBULATORY STATUS COMMUNICATION OF NEEDS Skin   Supervision Verbally Normal                       Personal Care Assistance Level of Assistance  Bathing, Dressing Bathing Assistance: Limited assistance   Dressing Assistance: Limited assistance (lower body)     Functional Limitations Info             SPECIAL CARE FACTORS FREQUENCY  PT (By licensed PT), OT (By licensed OT)     PT Frequency: 3X/week OT Frequency: 3X/week             Contractures Contractures Info: Not present    Additional Factors Info  Allergies   Allergies Info: Tide soap           Current Medications (06/11/2016):  This is the current hospital active medication list Current Facility-Administered Medications  Medication Dose Route Frequency Provider Last Rate Last Dose  . acetaminophen (TYLENOL) tablet 325-650 mg  325-650 mg Oral Q4H PRN Jacquelynn CreePamela S Love, PA-C      . alum & mag hydroxide-simeth (MAALOX/MYLANTA) 200-200-20 MG/5ML suspension 30 mL  30 mL Oral Q4H PRN Jacquelynn CreePamela S Love, PA-C      . bisacodyl (DULCOLAX) suppository 10 mg  10 mg Rectal Daily PRN Jacquelynn CreePamela S Love, PA-C      . diphenhydrAMINE (BENADRYL) 12.5 MG/5ML elixir 12.5-25 mg  12.5-25 mg Oral Q6H PRN Jacquelynn CreePamela S Love, PA-C      . gabapentin (NEURONTIN) capsule 300 mg  300 mg Oral TID Ankit Karis JubaAnil Patel, MD   300 mg at 06/11/16 0855  . glipiZIDE (GLUCOTROL XL) 24 hr tablet 2.5 mg  2.5 mg Oral Q breakfast Jacquelynn Creeamela S Love, PA-C   2.5 mg at 06/11/16 0855  . guaiFENesin-dextromethorphan (ROBITUSSIN DM) 100-10 MG/5ML syrup 5-10 mL  5-10 mL Oral Q6H PRN Evlyn KannerPamela S Love, PA-C      . losartan (COZAAR) tablet 100 mg  100 mg Oral Daily Jacquelynn Creeamela S Love, PA-C   100 mg at 06/11/16 [id number]   And  . hydrochlorothiazide (HYDRODIURIL) tablet 25 mg  25 mg Oral Daily Jacquelynn Creeamela S Love, PA-C   25 mg at 06/11/16 0855  . insulin aspart (novoLOG) injection 0-15 Units  0-15 Units Subcutaneous TID WC Jacquelynn CreePamela S Love, PA-C   3 Units at 06/10/16 1853  . lubiprostone (AMITIZA) capsule 24 mcg  24 mcg Oral BID WC Evlyn KannerPamela S Love, PA-C   24 mcg at 06/11/16 0855  . menthol-cetylpyridinium (CEPACOL) lozenge 3 mg  1 lozenge Oral PRN Evlyn KannerPamela S Love, PA-C       Or  . phenol (CHLORASEPTIC) mouth spray 1 spray  1 spray Mouth/Throat PRN Jacquelynn CreePamela S Love, PA-C      . metFORMIN (GLUCOPHAGE) tablet 1,000 mg  1,000 mg Oral BID WC Evlyn KannerPamela S Love, PA-C   1,000 mg at 06/11/16 0855  . methocarbamol (ROBAXIN) tablet 750 mg  750 mg Oral QID PRN Jacquelynn CreePamela S  Love, PA-C      . oxyCODONE (Oxy IR/ROXICODONE) immediate release tablet 10 mg  10 mg Oral Q4H PRN Jacquelynn CreePamela S Love, PA-C   10 mg at 06/11/16 0454  . oxyCODONE (Oxy IR/ROXICODONE) immediate release tablet 10 mg  10 mg Oral Once Gordy SaversPeter F Kwiatkowski, MD      . oxyCODONE (Oxy IR/ROXICODONE) immediate release tablet 10 mg  10 mg Oral BID Ranelle OysterZachary T Swartz, MD   10 mg at 06/11/16 0857  . polyethylene glycol (MIRALAX / GLYCOLAX) packet 17 g  17 g Oral BID Jacquelynn Creeamela S Love, PA-C   17 g at 06/10/16 2043  . potassium chloride SA (K-DUR,KLOR-CON) CR tablet 20 mEq  20 mEq Oral Daily Jacquelynn Creeamela S Love, PA-C   20 mEq at 06/11/16 0855  . pravastatin (PRAVACHOL) tablet 20 mg  20 mg Oral Daily Jacquelynn Creeamela S Love, PA-C   20 mg at 06/11/16 0855  . prochlorperazine (COMPAZINE) tablet 5-10 mg  5-10 mg Oral Q6H PRN Jacquelynn CreePamela S Love, PA-C       Or  . prochlorperazine (COMPAZINE) injection 5-10 mg  5-10 mg Intramuscular Q6H PRN Jacquelynn CreePamela S Love, PA-C       Or  . prochlorperazine (COMPAZINE) suppository 12.5 mg  12.5 mg Rectal Q6H PRN Jacquelynn CreePamela S Love, PA-C      . sodium phosphate (FLEET) 7-19 GM/118ML enema 1 enema  1 enema Rectal Once PRN Pamela S Love, PA-C      . traZODone (DESYREL) tablet 25-50 mg  25-50 mg Oral QHS PRN Jacquelynn CreePamela S Love, PA-C      . tuberculin injection 5 Units  5 Units Intradermal Once Jacquelynn CreePamela S Love, PA-C         Discharge Medications: Please see discharge summary for a list of discharge medications.  Relevant Imaging Results:  Relevant Lab Results:   Additional Information Pt is a workers' compensation case and the adjuster has approved short-term/respite care at ALF until he has been cleared by neurosurgeon to be independent.  Cira RueDeidra Minga is Workers' Runner, broadcasting/film/videoComp case manager and willing to talk with you about the case.  Tonio Seider, Vista DeckJennifer Capps, LCSW

## 2016-06-11 NOTE — Progress Notes (Signed)
[place] PHYSICAL MEDICINE & REHABILITATION     PROGRESS NOTE  Subjective/Complaints:  Persistent hematuria. Mild pain. Legs perhaps better. Spasms present  ROS:  +Neuropathic pain. Denies CP, SOB, N/V/D. No insomnia, anxiety,   Objective: Vital Signs: Blood pressure 126/90, pulse 71, temperature 99 F (37.2 C), temperature source Oral, resp. rate 18, height 6\' 1"  (1.854 m), weight 85.3 kg (188 lb), SpO2 98 %. [place] Renal  Result Date: 06/09/2016 CLINICAL DATA:  Hematuria EXAM: RENAL / URINARY TRACT ULTRASOUND COMPLETE COMPARISON:  CT of the lumbar spine May 23, 2016 FINDINGS: Right Kidney: Length: 11.1 cm. There is a 3.7 x 3.7 x 3.4 cm mass in the lower right kidney which is anechoic with increased through transmission. The attenuation in this mass on a recent CT lumbar spine is less than 10 Hounsfield units with multiple measurements. The right kidney is otherwise normal. Left Kidney: Length: 11.5 cm. Echogenicity within normal limits. No mass or hydronephrosis visualized. Bladder: Appears normal for degree of bladder distention. The prostate is enlarged measuring 6.6 x 5.3 x 5.3 cm IMPRESSION: 1. The mass in the lower right kidney is consistent with a cyst based on ultrasound and CT imaging. 2. Prostate enlargement. 3. A definitive cause for hematuria is not seen. CT and MRI are both much more sensitive and specific for causes of hematuria and could be performed if concern persists. Electronically Signed   By: Gerome Samavid  Williams III M.D   On: 06/09/2016 13:58    Recent Labs  06/09/16 1225 06/10/16 1058  WBC 10.5 9.3  HGB 12.4* 12.5*  HCT 37.2* 37.8*  PLT 354 402*   No results for input(s): NA, K, CL, GLUCOSE, BUN, CREATININE, CALCIUM in the last 72 hours.  Invalid input(s): CO CBG (last 3)   Recent Labs  06/10/16 1643 06/10/16 2126 06/11/16 0640  GLUCAP 157* 166* 116*    Wt Readings from Last 3 Encounters:  06/06/16 85.3 kg (188 lb)  06/03/16 85.3 kg (188 lb)  05/26/16 85.6  kg (188 lb 12.8 oz)    Physical Exam:  BP 126/90 (BP Location: Right Arm)   Pulse 71   Temp 99 F (37.2 C) (Oral)   Resp 18   Ht 6\' 1"  (1.854 m)   Wt 85.3 kg (188 lb)   SpO2 98%   BMI 24.80 kg/m  Constitutional: He appears well-developed and well-nourished.  HENT: Normocephalic and atraumatic.  Eyes: Conjunctivae and EOM are normal.   Cardiovascular: Normal rate and regular rhythm.   Respiratory: Effort normal and breath sounds normal.  GI: Soft. Bowel sounds are normal.  Neurological: He is alert and oriented.  Speech clear.  Motor: 3+ to 4/5 throughout (pain inhibition) both lower ext. No sensory deficits Uro: cranberry colored urine Skin: Skin is warm and dry. Back wound c/d/i.  Psychiatric: He is appropriate    Assessment/Plan: 1. Functional deficits secondary to RLE radiculopathy which require 3+ hours per day of interdisciplinary therapy in a comprehensive inpatient rehab setting. Physiatrist is providing close team supervision and 24 hour management of active medical problems listed below. Physiatrist and rehab team continue to assess barriers to discharge/monitor patient progress toward functional and medical goals.  Function:  Bathing Bathing position   Position: Shower  Bathing parts Body parts bathed by patient: Right arm, Left arm, Left lower leg, Chest, Abdomen, Front perineal area, Buttocks, Right upper leg, Right lower leg, Left upper leg, Back    Bathing assist Assist Level: Supervision or verbal cues, Set up  Set up : To obtain items  Upper Body Dressing/Undressing Upper body dressing   What is the patient wearing?: Pull over shirt/dress, Orthosis     Pull over shirt/dress - Perfomed by patient: Thread/unthread right sleeve, Put head through opening, Pull shirt over trunk, Thread/unthread left sleeve       Orthosis activity level: Performed by patient  Upper body assist Assist Level: Set up   Set up : To obtain clothing/put away  Lower Body  Dressing/Undressing Lower body dressing   What is the patient wearing?: Underwear, Pants, Ted Hose, Non-skid slipper socks Underwear - Performed by patient: Thread/unthread left underwear leg, Pull underwear up/down, Thread/unthread right underwear leg   Pants- Performed by patient: Thread/unthread right pants leg, Thread/unthread left pants leg, Pull pants up/down     Non-skid slipper socks- Performed by helper: Don/doff right sock, Don/doff left sock             TED Hose - Performed by patient: Don/doff right TED hose, Don/doff left TED hose TED Hose - Performed by helper: Don/doff right TED hose, Don/doff left TED hose  Lower body assist Assist for lower body dressing: More than reasonable time      Toileting Toileting Toileting activity did not occur: Refused Toileting steps completed by patient: Adjust clothing prior to toileting, Performs perineal hygiene, Adjust clothing after toileting   Toileting Assistive Devices: Grab bar or rail  Toileting assist Assist level: Supervision or verbal cues   Transfers Chair/bed transfer   Chair/bed transfer method: Ambulatory Chair/bed transfer assist level: Supervision or verbal cues Chair/bed transfer assistive device: Armrests, Hospital doctor     Max distance: 1000 ft Assist level: Supervision or verbal cues   Wheelchair   Type: Manual Max wheelchair distance: 200 ft Assist Level: Supervision or verbal cues  Cognition Comprehension Comprehension assist level: Understands complex 90% of the time/cues 10% of the time  Expression Expression assist level: Expresses complex 90% of the time/cues < 10% of the time  Social Interaction Social Interaction assist level: Interacts appropriately 90% of the time - Needs monitoring or encouragement for participation or interaction.  Problem Solving Problem solving assist level: Solves complex 90% of the time/cues < 10% of the time  Memory Memory assist level: Complete  Independence: No helper     Medical Problem List and Plan: 1.  Gait abnormality secondary to RLE radiculopathy.  -continue CIR therapies  -progressing toward goals  -working on dispo 2.  DVT Prophylaxis/Anticoagulation: Pharmaceutical: Lovenox (hold) 3. Pain Management:  Managed on oxycodone prn.   -schedule oxycodone 10mg  daily at 0700 and 1200  Gabapentin increased to 400 TID on 8/16   -prn robaxin 4. Mood: LCSW to follow for evaluation and support.  5. Neuropsych: This patient is capable of making decisions on his own behalf. 6. Skin/Wound Care: Monitor wound daily for healing.  7. Fluids/Electrolytes/Nutrition: Monitor I/O.   8. T2DM: Monitor BS ac/hs. Continue glucotrol Xl daily and metformin bid.  9. HTN: Monitor BP bid. Continue cozaar, HCTZ.  10. Constipation: On amitiza bid.      wean 11. Leukocytosis  WBCs 10.5 yesterday  Will cont to monitor 12. ABLA  Hb 12.4   13. Hematuria:  -persistent cranberry colored urine with blood/clot at end of stream.   -ua with blood, ucx negative  -PSA 12  -cyst/mass 3x3x3cm lower right kidney/large prostate  -lovenox stopped  -labs stable  -appreciate urology consult. CT/cysto as outpatient   LOS (Days) 6 A FACE  TO FACE EVALUATION WAS PERFORMED  Jacob Bond 06/11/2016 8:52 AM

## 2016-06-12 ENCOUNTER — Inpatient Hospital Stay (HOSPITAL_COMMUNITY): Payer: Self-pay | Admitting: Physical Therapy

## 2016-06-12 ENCOUNTER — Inpatient Hospital Stay (HOSPITAL_COMMUNITY): Payer: Self-pay | Admitting: Occupational Therapy

## 2016-06-12 LAB — GLUCOSE, CAPILLARY
GLUCOSE-CAPILLARY: 89 mg/dL (ref 65–99)
GLUCOSE-CAPILLARY: 91 mg/dL (ref 65–99)
GLUCOSE-CAPILLARY: 148 mg/dL — AB (ref 65–99)
Glucose-Capillary: 147 mg/dL — ABNORMAL HIGH (ref 65–99)

## 2016-06-12 MED ORDER — METFORMIN HCL 1000 MG PO TABS: 1000.0000 mg | ORAL_TABLET | Freq: Two times a day (BID) | ORAL | 0 refills | Status: AC

## 2016-06-12 MED ORDER — METHOCARBAMOL 750 MG PO TABS: 750.0000 mg | ORAL_TABLET | Freq: Four times a day (QID) | ORAL | 0 refills | Status: AC | PRN

## 2016-06-12 MED ORDER — GABAPENTIN 300 MG PO CAPS: 300.0000 mg | ORAL_CAPSULE | Freq: Three times a day (TID) | ORAL | 0 refills | Status: DC

## 2016-06-12 MED ORDER — PRAVASTATIN SODIUM 20 MG PO TABS: 20.0000 mg | ORAL_TABLET | Freq: Every day | ORAL | 0 refills | Status: AC

## 2016-06-12 MED ORDER — LOSARTAN POTASSIUM-HCTZ 100-25 MG PO TABS: 1.0000 | ORAL_TABLET | Freq: Every day | ORAL | 0 refills | Status: AC

## 2016-06-12 MED ORDER — GLIPIZIDE ER 2.5 MG PO TB24: 2.5000 mg | ORAL_TABLET | Freq: Every day | ORAL | 0 refills | Status: AC

## 2016-06-12 MED ORDER — OXYCODONE HCL 10 MG PO TABS: 10.0000 mg | ORAL_TABLET | Freq: Four times a day (QID) | ORAL | 0 refills | Status: AC | PRN

## 2016-06-12 MED ORDER — LUBIPROSTONE 24 MCG PO CAPS: 24.0000 ug | ORAL_CAPSULE | Freq: Two times a day (BID) | ORAL | 0 refills | Status: DC

## 2016-06-12 MED ORDER — GLIPIZIDE ER 2.5 MG PO TB24: 2.5000 mg | ORAL_TABLET | Freq: Every day | ORAL | 0 refills | Status: DC

## 2016-06-12 MED ORDER — POLYETHYLENE GLYCOL 3350 17 G PO PACK: 17.0000 g | PACK | Freq: Two times a day (BID) | ORAL | 0 refills | Status: AC

## 2016-06-12 MED ORDER — POTASSIUM CHLORIDE CRYS ER 20 MEQ PO TBCR: 20.0000 meq | EXTENDED_RELEASE_TABLET | Freq: Every day | ORAL | 0 refills | Status: AC

## 2016-06-12 NOTE — Progress Notes (Signed)
Occupational Therapy Discharge Summary  Patient Details  Name: Jacob Bond MRN: [id number] Date of Birth: 01-06-1956  Patient has met 8 of 8 long term goals due to improved activity tolerance, improved balance, postural control and improved coordination.  Patient to discharge at overall Supervision- mod I level.  Patient will be discharging to ALF due to surgeons recommendation of 24/7 assist which can not be provided for pt.  Recommendation:  Patient will benefit from ongoing skilled OT services in [place] to continue to advance functional skills in the area of BADL and iADL.  Equipment: Shower chair with back  Reasons for discharge: treatment goals met and discharge from hospital  Patient/family agrees with progress made and goals achieved: Yes  OT Discharge Precautions/Restrictions  Precautions Precautions: Back;Fall Precaution Comments: Pt able to recall 3/3 precautions Required Braces or Orthoses: Spinal Brace Spinal Brace: Lumbar corset;Applied in sitting position Restrictions Weight Bearing Restrictions: No ADL ADL ADL Comments: see Functional Assessment Tools Vision/Perception  Vision- History Baseline Vision/History: No visual deficits Patient Visual Report: No change from baseline  Cognition Overall Cognitive Status: Within Functional Limits for tasks assessed Arousal/Alertness: Awake/alert Orientation Level: Oriented X4 Memory: Appears intact Awareness: Appears intact Problem Solving: Appears intact Safety/Judgment: Appears intact Sensation Coordination Gross Motor Movements are Fluid and Coordinated: Yes Fine Motor Movements are Fluid and Coordinated: Yes Motor  Motor Motor: Within Functional Limits  Trunk/Postural Assessment  Cervical Assessment Cervical Assessment: Within Functional Limits Thoracic Assessment Thoracic Assessment: Within Functional Limits (Spinal pre-cautions) Lumbar Assessment Lumbar Assessment: Exceptions to [facility]  (Spinal pre-cautions) Postural Control Postural Control: Within Functional Limits  Balance Balance Balance Assessed: Yes Dynamic Sitting Balance Dynamic Sitting - Balance Support: No upper extremity supported;Feet supported Dynamic Sitting - Level of Assistance: 6: Modified independent (Device/Increase time) Static Standing Balance Static Standing - Balance Support: During functional activity;No upper extremity supported Static Standing - Level of Assistance: 6: Modified independent (Device/Increase time);5: Stand by assistance Dynamic Standing Balance Dynamic Standing - Balance Support: During functional activity Dynamic Standing - Level of Assistance: 6: Modified independent (Device/Increase time);5: Stand by assistance Extremity/Trunk Assessment RUE Assessment RUE Assessment: Within Functional Limits LUE Assessment LUE Assessment: Within Functional Limits   See Function Navigator for Current Functional Status.  [name], Tiffanie Blassingame C 06/12/2016, 7:13 AM

## 2016-06-12 NOTE — Discharge Instructions (Signed)
Inpatient Rehab Discharge Instructions  Gita Kudolisha Collier Discharge date and time: 06/13/16   Activities/Precautions/ Functional Status: Activity: no lifting, driving, or strenuous exercise till cleared by MD.  Diet: diabetic diet Wound Care: keep wound clean and dry . Contact MD if you develop any problems with your incision/wound--redness, swelling, increase in pain, drainage or if you develop fever or chills.   Functional status:  ___ No restrictions     ___ Walk up steps independently ___ 24/7 supervision/assistance   ___ Walk up steps with assistance _X__ Intermittent supervision/assistance  _X__ Bathe/dress independently __X_ Walk with walker    ___ Bathe/dress with assistance ___ Walk Independently    ___ Shower independently ___ Walk with assistance    ___ Shower with assistance _X__ No alcohol     ___ Return to work/school ________   Special Instructions: 1. No bending, twisting or arching. 2. Wear brace when you are at edge of bed or out of bed.  3. Contact urology for a sooner appointment if bleeding gets worse or you develop problems urinating.  4. Can substitute Senna S for miralax twice a day if you prefer.   My questions have been answered and I understand these instructions. I will adhere to these goals and the provided educational materials after my discharge from the hospital.  Patient/Caregiver Signature _______________________________ Date __________  Clinician Signature _______________________________________ Date __________  Please bring this form and your medication list with you to all your follow-up doctor's appointments.

## 2016-06-12 NOTE — Progress Notes (Signed)
[place] PHYSICAL MEDICINE & REHABILITATION     PROGRESS NOTE  Subjective/Complaints:  Urine amber in color, clear. No dysuria. No fever.  ROS:  +Neuropathic pain. Denies CP, SOB, N/V/D. No insomnia, anxiety,   Objective: Vital Signs: Blood pressure 129/80, pulse (!) 59, temperature 98.4 F (36.9 C), temperature source Oral, resp. rate 16, height 6\' 1"  (1.854 m), weight 85.3 kg (188 lb), SpO2 100 %. No results found.  Recent Labs  06/09/16 1225 06/10/16 1058  WBC 10.5 9.3  HGB 12.4* 12.5*  HCT 37.2* 37.8*  PLT 354 402*   No results for input(s): NA, K, CL, GLUCOSE, BUN, CREATININE, CALCIUM in the last 72 hours.  Invalid input(s): CO CBG (last 3)   Recent Labs  06/11/16 1124 06/11/16 2110 06/12/16 0645  GLUCAP 108* 181* 89    Wt Readings from Last 3 Encounters:  06/06/16 85.3 kg (188 lb)  06/03/16 85.3 kg (188 lb)  05/26/16 85.6 kg (188 lb 12.8 oz)    Physical Exam:  BP 129/80 (BP Location: Right Arm)   Pulse (!) 59   Temp 98.4 F (36.9 C) (Oral)   Resp 16   Ht 6\' 1"  (1.854 m)   Wt 85.3 kg (188 lb)   SpO2 100%   BMI 24.80 kg/m  Constitutional: He appears well-developed and well-nourished.  HENT: Normocephalic and atraumatic.  Eyes: Conjunctivae and EOM are normal.   Cardiovascular: Normal rate and regular rhythm.   Respiratory: Effort normal and breath sounds normal.  GI: Soft. Bowel sounds are normal.  Neurological: He is alert and oriented.  Speech clear.  Motor: 3+ to 4/5 throughout (pain inhibition) both lower ext. No sensory deficits Uro: cranberry colored urine Skin: Skin is warm and dry. Back wound c/d/i.  Psychiatric: He is appropriate    Assessment/Plan: 1. Functional deficits secondary to RLE radiculopathy which require 3+ hours per day of interdisciplinary therapy in a comprehensive inpatient rehab setting. Physiatrist is providing close team supervision and 24 hour management of active medical problems listed below. Physiatrist and  rehab team continue to assess barriers to discharge/monitor patient progress toward functional and medical goals.  Function:  Bathing Bathing position   Position: Shower  Bathing parts Body parts bathed by patient: Right arm, Left arm, Left lower leg, Chest, Abdomen, Front perineal area, Buttocks, Right upper leg, Right lower leg, Left upper leg, Back    Bathing assist Assist Level: Supervision or verbal cues, Set up   Set up : To obtain items  Upper Body Dressing/Undressing Upper body dressing   What is the patient wearing?: Pull over shirt/dress, Orthosis     Pull over shirt/dress - Perfomed by patient: Thread/unthread right sleeve, Put head through opening, Pull shirt over trunk, Thread/unthread left sleeve       Orthosis activity level: Performed by patient  Upper body assist Assist Level: Set up   Set up : To obtain clothing/put away  Lower Body Dressing/Undressing Lower body dressing   What is the patient wearing?: Underwear, Pants, Ted Hose, Non-skid slipper socks Underwear - Performed by patient: Thread/unthread left underwear leg, Pull underwear up/down, Thread/unthread right underwear leg   Pants- Performed by patient: Thread/unthread right pants leg, Thread/unthread left pants leg, Pull pants up/down     Non-skid slipper socks- Performed by helper: Don/doff right sock, Don/doff left sock             TED Hose - Performed by patient: Don/doff right TED hose, Don/doff left TED hose TED Hose - Performed by  helper: Don/doff right TED hose, Don/doff left TED hose  Lower body assist Assist for lower body dressing: More than reasonable time      Toileting Toileting Toileting activity did not occur: Refused Toileting steps completed by patient: Adjust clothing prior to toileting, Performs perineal hygiene, Adjust clothing after toileting   Toileting Assistive Devices: Grab bar or rail  Toileting assist Assist level: Supervision or verbal cues    Transfers Chair/bed transfer   Chair/bed transfer method: Ambulatory Chair/bed transfer assist level: Supervision or verbal cues Chair/bed transfer assistive device: Hospital doctorCane     Locomotion Ambulation     Max distance: 200 Assist level: Touching or steadying assistance (Pt > 75%)   Wheelchair   Type: Manual Max wheelchair distance: 200 ft Assist Level: Supervision or verbal cues  Cognition Comprehension Comprehension assist level: Follows complex conversation/direction with no assist  Expression Expression assist level: Expresses complex ideas: With no assist  Social Interaction Social Interaction assist level: Interacts appropriately with others - No medications needed.  Problem Solving Problem solving assist level: Solves complex problems: Recognizes & self-corrects  Memory Memory assist level: Complete Independence: No helper     Medical Problem List and Plan: 1.  Gait abnormality secondary to RLE radiculopathy.  -continue CIR therapies  -pt to discharge to ALF 2.  DVT Prophylaxis/Anticoagulation: Pharmaceutical: Lovenox (hold) 3. Pain Management:  Managed on oxycodone prn.   -schedule oxycodone 10mg  daily at 0700 and 1200  Gabapentin increased to 400 TID on 8/16   -prn robaxin 4. Mood: LCSW to follow for evaluation and support.  5. Neuropsych: This patient is capable of making decisions on his own behalf. 6. Skin/Wound Care: Monitor wound daily for healing.  7. Fluids/Electrolytes/Nutrition: Monitor I/O.   8. T2DM: Monitor BS ac/hs. Continue glucotrol Xl daily and metformin bid.  9. HTN: Monitor BP bid. Continue cozaar, HCTZ.  10. Constipation: On amitiza bid.      wean 11. Leukocytosis  WBCs 10.5 most recently  Will cont to monitor 12. ABLA  Hb 12.4   13. Hematuria:  -PSA 12  -cyst/mass 3x3x3cm lower right kidney/large prostate  -lovenox stopped  -labs stable  -appreciate urology consult. CT/cysto as outpatient   LOS (Days) 7 A FACE TO FACE EVALUATION WAS  PERFORMED  Tarahji Ramthun T 06/12/2016 9:42 AM

## 2016-06-12 NOTE — Progress Notes (Signed)
Social Work Patient ID: Ozro Russett, male   DOB: [birth year], 60 y.o.   MRN: [id number]   CSW met with pt on 06-10-16 and again 06-11-16 to update him on team conference discussion and discuss d/c planning.  Made sure he understood what workers' comp Tourist information centre manager meant when she said that Dr. Rolena Infante does not want pt to go back to the hotel alone.  He expressed understanding about this and that it meant he would need to go to ALF.  CSW spoke with case manager, Gatha Mayer and learned that ALF transfer has been approved and that CSW can pursue search.  Spoke with pt's dtr about this via telephone, as well, and emailed her the ALF lists.  [facility] came to meet pt and it seems that pt may have a bed offered to him.  CSW's co-workers will f/u on this for pt.  Expected d/c on 06-13-16.

## 2016-06-12 NOTE — Progress Notes (Signed)
Occupational Therapy Session Note  Patient Details  Name: Jacob Bond MRN: [id number] Date of Birth: 1956/09/24  Today's Date: 06/12/2016 OT Individual Time: 1000-1100 OT Individual Time Calculation (min): 60 min     Short Term Goals:Week 1:  OT Short Term Goal 1 (Week 1): STG=LTG due to LOS  Skilled Therapeutic Interventions/Progress Updates:    Pt seen for OT ADL bathing/dressing session. Pt sitting up in w/c upon arrival, voicing having "off day", however, eager to take shower. He ambulated throughout room using QC and distant supervision to gather clothing and items in prep for showering task. He bathed/ dressed seated on 3-1 BSC mod I, using lateral leans to complete buttock hygiene and clothing management. Educated regarding sequencing of bathing/ dressing tasks due to need for TLSO.  Grooming completed standing at sink, with pt able to recall modified techniques for tasks while maintaining spinal precautions.  He ambulated throughout room to gather laundry, using reacher to obtain items. He ambulated to [employer] laundry facility and completed laundry task with supervision. Educated regarding techniques and modified household tasks in order to maintain spinal precautions. Pt returned to room at end of session, left sitting in recliner with all needs in reach.    Therapy Documentation Precautions:  Precautions Precautions: Back, Fall Precaution Booklet Issued: Yes (comment) Precaution Comments: Pt aware of all precautions Required Braces or Orthoses: Spinal Brace Spinal Brace: Lumbar corset, Applied in sitting position Restrictions Weight Bearing Restrictions: No Pain: Pain Assessment Pain Assessment: 0-10 Pain Score: 6  Pain Type: Neuropathic pain Pain Location: Leg Pain Orientation: Right;Left Pain Descriptors / Indicators: Burning Pain Frequency: Intermittent Pain Onset: On-going Pain Intervention(s): RN made aware, shower Multiple Pain Sites: No ADL: ADL ADL  Comments: see Functional Assessment Tools  See Function Navigator for Current Functional Status.   Therapy/Group: Individual Therapy  [name], [name] 06/12/2016, 7:05 AM

## 2016-06-12 NOTE — Progress Notes (Signed)
Physical Therapy Discharge Summary  Patient Details  Name: Jacob Bond MRN: [id number] Date of Birth: 1956-04-09  Today's Date: 06/12/2016 PT Individual Time: 0900-1000 and 1430-1545 PT Individual Time Calculation (min): 60 min and 75 min (total 135 min)     Patient has met 8 of 8 long term goals due to improved activity tolerance, improved balance, improved postural control, increased strength, decreased pain, ability to compensate for deficits and functional use of  right lower extremity and left lower extremity.  Patient to discharge at an ambulatory level Supervision for stairs, mobility in unfamiliar environments and community; mod I short distances in familiar environment.  Reasons goals not met: All goals met  Recommendation:  Patient will benefit from ongoing skilled PT services in assisted living facility to continue to advance safe functional mobility, address ongoing impairments in strength, coordination, balance, endurance, and minimize fall risk.   Equipment: Quad cane  Reasons for discharge: treatment goals met and discharge from hospital  Patient/family agrees with progress made and goals achieved: Yes  PT Discharge Precautions/RestrictionsPrecautions Precautions: Back;Fall Precaution Comments: Pt able to recall 3/3 precautions Required Braces or Orthoses: Spinal Brace Spinal Brace: Lumbar corset;Applied in sitting position Restrictions Weight Bearing Restrictions: No Pain Pain Assessment Pain Assessment: 0-10 Pain Score: 6  Pain Type: Neuropathic pain Pain Location: Leg Pain Orientation: Right;Left Pain Descriptors / Indicators: Burning Pain Frequency: Intermittent Pain Onset: On-going Pain Intervention(s): Medication (See eMAR) Multiple Pain Sites: No Vision/Perception    WFL Cognition Overall Cognitive Status: Within Functional Limits for tasks assessed Arousal/Alertness: Awake/alert Orientation Level: Oriented X4 Memory: Appears  intact Awareness: Appears intact Problem Solving: Appears intact Safety/Judgment: Appears intact Sensation Coordination Gross Motor Movements are Fluid and Coordinated: Yes Fine Motor Movements are Fluid and Coordinated: Yes Motor  Motor Motor: Within Functional Limits  Mobility Bed Mobility Bed Mobility: Supine to Sit;Sit to Supine Supine to Sit: 6: Modified independent (Device/Increase time) Sit to Supine: 6: Modified independent (Device/Increase time) Transfers Transfers: Yes Stand Pivot Transfers: 6: Modified independent (Device/Increase time) Locomotion  Ambulation Ambulation: Yes Ambulation/Gait Assistance: 6: Modified independent (Device/Increase time) Ambulation Distance (Feet): 300 Feet Assistive device: Small based quad cane Gait Gait: Yes Gait Pattern: Impaired Gait Pattern: Decreased stride length;Antalgic Gait velocity: decreased for age/gender norms Stairs / Additional Locomotion Stairs: Yes Stairs Assistance: 5: Supervision Stairs Assistance Details: Verbal cues for technique;Verbal cues for precautions/safety Stair Management Technique: Two rails;Alternating pattern;Forwards Number of Stairs: 16 Height of Stairs: 6 Ramp: 5: Supervision Curb: 5: Supervision Wheelchair Mobility Wheelchair Mobility: No  Trunk/Postural Assessment  Cervical Assessment Cervical Assessment: Within Functional Limits Thoracic Assessment Thoracic Assessment: Within Functional Limits (Spinal pre-cautions) Lumbar Assessment Lumbar Assessment: Exceptions to [facility] (Spinal pre-cautions) Postural Control Postural Control: Within Functional Limits  Balance Balance Balance Assessed: Yes Standardized Balance Assessment Standardized Balance Assessment: Berg Balance Test Berg Balance Test Sit to Stand: Able to stand without using hands and stabilize independently Standing Unsupported: Able to stand safely 2 minutes Sitting with Back Unsupported but Feet Supported on Floor or Stool:  Able to sit safely and securely 2 minutes Stand to Sit: Sits safely with minimal use of hands Transfers: Able to transfer safely, minor use of hands Standing Unsupported with Eyes Closed: Able to stand 10 seconds safely Standing Ubsupported with Feet Together: Able to place feet together independently and stand 1 minute safely From Standing, Reach Forward with Outstretched Arm: Loses balance while trying/requires external support (unable to reach d/t pain) From Standing Position, Pick up Object from Floor: Unable to try/needs  assist to keep balance (NT d/t back precautions) From Standing Position, Turn to Look Behind Over each Shoulder: Looks behind from both sides and weight shifts well Turn 360 Degrees: Able to turn 360 degrees safely but slowly Standing Unsupported, Alternately Place Feet on Step/Stool: Able to stand independently and safely and complete 8 steps in 20 seconds Standing Unsupported, One Foot in Front: Able to plae foot ahead of the other independently and hold 30 seconds Standing on One Leg: Able to lift leg independently and hold > 10 seconds Total Score: 45 Dynamic Sitting Balance Dynamic Sitting - Balance Support: No upper extremity supported;Feet supported Dynamic Sitting - Level of Assistance: 6: Modified independent (Device/Increase time) Static Standing Balance Static Standing - Balance Support: During functional activity;No upper extremity supported Static Standing - Level of Assistance: 6: Modified independent (Device/Increase time);5: Stand by assistance Static Stance: Eyes closed Static Stance: Eyes Closed: x30 sec mod I Dynamic Standing Balance Dynamic Standing - Balance Support: During functional activity;No upper extremity supported Dynamic Standing - Level of Assistance: 6: Modified independent (Device/Increase time);5: Stand by assistance Dynamic Standing - Balance Activities: Lateral lean/weight shifting;Reaching across midline;Forward lean/weight  shifting Extremity Assessment  RUE Assessment RUE Assessment: Within Functional Limits LUE Assessment LUE Assessment: Within Functional Limits RLE Assessment RLE Assessment: Exceptions to [facility] RLE Strength RLE Overall Strength Comments: grossly 4+/5 throughout, strength against resistance limited due to pain in low back LLE Assessment LLE Assessment: Exceptions to [facility] LLE Strength LLE Overall Strength Comments: grossly 4+/5 throughout, strength against resistance limited due to pain in low back  Skilled Therapeutic Intervention: Tx 1: Pt received seated in w/c, c/o pain as above and agreeable to treatment. Pt reporting increase in pain compared to previous days, and feeling "off"; states today is "just not a good day"; able to perform all mobility however with slower speed and increase in rest breaks. Gait to/from gym with quad cane and S. Assessed all mobility as described above with mod I to S overall, as well as furniture transfer with S. Assessed Berg; patient demonstrates increased fall risk as noted by score of 45/56 on Berg Balance Scale.  (<36= high risk for falls, close to 100%; 37-45 significant >80%; 46-51 moderate >50%; 52-55 lower >25%). Floor object and forward reaching NT due to back precautions. Returned to room at end of session, all needs in reach.   Tx 2: Pt receive seated in w/c, c/o pain as above and agreeable to treatment. Pt ambulated to/from gym with quad cane and mod I overall. Session focused on dynamic standing balance, dynamic gait and use of grabber to improve functional mobility at home to retrieve items from floor; activities included Wii balance games and playing horseshoes. Returned to room at end of session as above; remained seated on EOB at end of session, all needs in reach. Pt with no further questions/concerns regarding d/c at this time.   See Function Navigator for Current Functional Status.  Benjiman Core Tygielski 06/12/2016, 7:41 AM

## 2016-06-13 LAB — GLUCOSE, CAPILLARY
GLUCOSE-CAPILLARY: 109 mg/dL — AB (ref 65–99)
Glucose-Capillary: 89 mg/dL (ref 65–99)

## 2016-06-13 NOTE — Progress Notes (Signed)
Pt discharged to SNF. Report called @0955  to Marylene LandAngela, receiving nurse at facility. Pt escorted off unit in w/c with personal belonging by Holly PondLateneesha, NT. Pt given scheduled medication, prior to discharge.

## 2016-06-13 NOTE — [place] FL2 (Signed)
[place] MEDICAID FL2 LEVEL OF CARE SCREENING TOOL     IDENTIFICATION  Patient Name: Jacob Bond Birthdate: 1956/06/14 Sex: male Admission Date (Current Location): 06/05/2016  [facility] [place]  [place]   Facility and Address:  [facility] [place], [address], [place], [place] [address]      Provider Number: [id number]  Attending Physician Name and Address:  Ranelle OysterZachary T Swartz, MD  Relative Name and Phone Number:       Current Level of Care: Other (Comment) (Acute Inpatient Rehabilitation) Recommended Level of Care: Assisted Living Facility Prior Approval Number:    Date Approved/Denied:   PASRR Number: [id number]  Discharge Plan: Other (Comment) (ALF)    Current Diagnoses: Patient Active Problem List   Diagnosis Date Noted  . Hematuria 06/09/2016  . Neuropathic pain   . Radicular low back pain   . Leukocytosis   . Acute blood loss anemia   . Type 2 diabetes mellitus without complication, without long-term current use of insulin (HCC)   . Spondylolisthesis of lumbar region 06/05/2016  . S/P lumbar fusion   . Surgery, elective   . Benign essential HTN   . Diabetes mellitus type 2 in nonobese (HCC)   . Chronic back pain 06/03/2016    Orientation RESPIRATION BLADDER Height & Weight     Self, Time, Situation, Place  Normal Continent Weight: 85.3 kg (188 lb) Height:  6\' 1"  (185.4 cm)  BEHAVIORAL SYMPTOMS/MOOD NEUROLOGICAL BOWEL NUTRITION STATUS      Continent Diet (CCHO )  AMBULATORY STATUS COMMUNICATION OF NEEDS Skin   Supervision Verbally Normal                       Personal Care Assistance Level of Assistance  Bathing, Dressing Bathing Assistance: Limited assistance   Dressing Assistance: Limited assistance     Functional Limitations Info             SPECIAL CARE FACTORS FREQUENCY  PT (By licensed PT), OT (By licensed OT)     PT Frequency: 3x/wk OT Frequency: 3x/wk            Contractures Contractures  Info: Not present    Additional Factors Info  Allergies   Allergies Info: tide soap           Current Medications (06/13/2016):  This is the current hospital active medication list Current Facility-Administered Medications  Medication Dose Route Frequency Provider Last Rate Last Dose  . acetaminophen (TYLENOL) tablet 325-650 mg  325-650 mg Oral Q4H PRN Jacquelynn CreePamela S Love, PA-C      . alum & mag hydroxide-simeth (MAALOX/MYLANTA) 200-200-20 MG/5ML suspension 30 mL  30 mL Oral Q4H PRN Jacquelynn CreePamela S Love, PA-C      . bisacodyl (DULCOLAX) suppository 10 mg  10 mg Rectal Daily PRN Jacquelynn CreePamela S Love, PA-C      . diphenhydrAMINE (BENADRYL) 12.5 MG/5ML elixir 12.5-25 mg  12.5-25 mg Oral Q6H PRN Jacquelynn CreePamela S Love, PA-C      . gabapentin (NEURONTIN) capsule 300 mg  300 mg Oral TID Ankit Karis JubaAnil Patel, MD   300 mg at 06/13/16 0810  . glipiZIDE (GLUCOTROL XL) 24 hr tablet 2.5 mg  2.5 mg Oral Q breakfast Jacquelynn Creeamela S Love, PA-C   2.5 mg at 06/13/16 0818  . guaiFENesin-dextromethorphan (ROBITUSSIN DM) 100-10 MG/5ML syrup 5-10 mL  5-10 mL Oral Q6H PRN Evlyn KannerPamela S Love, PA-C      . losartan (COZAAR) tablet 100 mg  100 mg  Oral Daily Jacquelynn Creeamela S Love, PA-C   100 mg at 06/13/16 0809   And  . hydrochlorothiazide (HYDRODIURIL) tablet 25 mg  25 mg Oral Daily Jacquelynn Creeamela S Love, PA-C   25 mg at 06/13/16 0810  . insulin aspart (novoLOG) injection 0-15 Units  0-15 Units Subcutaneous TID WC Jacquelynn CreePamela S Love, PA-C   3 Units at 06/10/16 1853  . lubiprostone (AMITIZA) capsule 24 mcg  24 mcg Oral BID WC Evlyn KannerPamela S Love, PA-C   24 mcg at 06/13/16 0818  . menthol-cetylpyridinium (CEPACOL) lozenge 3 mg  1 lozenge Oral PRN Evlyn KannerPamela S Love, PA-C       Or  . phenol (CHLORASEPTIC) mouth spray 1 spray  1 spray Mouth/Throat PRN Jacquelynn CreePamela S Love, PA-C      . metFORMIN (GLUCOPHAGE) tablet 1,000 mg  1,000 mg Oral BID WC Evlyn KannerPamela S Love, PA-C   1,000 mg at 06/13/16 0809  . methocarbamol (ROBAXIN) tablet 750 mg  750 mg Oral QID PRN Jacquelynn CreePamela S Love, PA-C      . oxyCODONE (Oxy  IR/ROXICODONE) immediate release tablet 10 mg  10 mg Oral Q4H PRN Jacquelynn CreePamela S Love, PA-C   10 mg at 06/13/16 0247  . oxyCODONE (Oxy IR/ROXICODONE) immediate release tablet 10 mg  10 mg Oral Once Gordy SaversPeter F Kwiatkowski, MD      . oxyCODONE (Oxy IR/ROXICODONE) immediate release tablet 10 mg  10 mg Oral BID Ranelle OysterZachary T Swartz, MD   10 mg at 06/13/16 0811  . polyethylene glycol (MIRALAX / GLYCOLAX) packet 17 g  17 g Oral BID Jacquelynn Creeamela S Love, PA-C   17 g at 06/13/16 16100812  . potassium chloride SA (K-DUR,KLOR-CON) CR tablet 20 mEq  20 mEq Oral Daily Jacquelynn Creeamela S Love, PA-C   20 mEq at 06/13/16 0809  . pravastatin (PRAVACHOL) tablet 20 mg  20 mg Oral Daily Jacquelynn Creeamela S Love, PA-C   20 mg at 06/13/16 0810  . prochlorperazine (COMPAZINE) tablet 5-10 mg  5-10 mg Oral Q6H PRN Jacquelynn CreePamela S Love, PA-C       Or  . prochlorperazine (COMPAZINE) injection 5-10 mg  5-10 mg Intramuscular Q6H PRN Jacquelynn CreePamela S Love, PA-C       Or  . prochlorperazine (COMPAZINE) suppository 12.5 mg  12.5 mg Rectal Q6H PRN Jacquelynn CreePamela S Love, PA-C      . sodium phosphate (FLEET) 7-19 GM/118ML enema 1 enema  1 enema Rectal Once PRN Jacquelynn CreePamela S Love, PA-C      . traZODone (DESYREL) tablet 25-50 mg  25-50 mg Oral QHS PRN Jacquelynn CreePamela S Love, PA-C      . tuberculin injection 5 Units  5 Units Intradermal Once Jacquelynn Creeamela S Love, PA-C   5 Units at 06/11/16 1625     Discharge Medications: Please see discharge summary for a list of discharge medications.  Relevant Imaging Results:  Relevant Lab Results:   Additional Information Pt is a workers' compensation case and the adjuster has approved short-term/respite care at ALF until he has been cleared by neurosurgeon to be independent.  Cira RueDeidra Minga is Workers' Runner, broadcasting/film/videoComp case manager and willing to talk with you about the case.  Tobey Schmelzle, LCSW

## 2016-06-13 NOTE — Progress Notes (Signed)
Social Work  Discharge Note  The overall goal for the admission was met for:   Discharge location: Yes - d/c to [facility] to reside until cleared by MD to travel  Length of Stay: Yes - 8 days  Discharge activity level: Yes - supervision  Home/community participation: Yes  Services provided included: MD, RD, PT, OT, RN, Pharmacy and SW  Financial Services: Worker's Comp  Follow-up services arranged: Home Health: RN, PT, OT referred but WC CM to arrange via preferred vendor , DME: [facility], tub seat via [facility] and Patient/Family has no preference for HH/DME agencies  Comments (or additional information):  WC has arranged for pt to have transport from hospital to pharmacy where he will pick up his medications and then onto the ALF  Patient/Family verbalized understanding of follow-up arrangements: Yes  Individual responsible for coordination of the follow-up plan: pt  Confirmed correct DME delivered: Jacquez Sheetz 06/13/2016    Coady Train

## 2016-06-13 NOTE — Progress Notes (Signed)
[place] PHYSICAL MEDICINE & REHABILITATION     PROGRESS NOTE  Subjective/Complaints:  Patient states that he return its frequently, UA, reviewed, no evidence of infection, evidence of hematuria  ROS:  +Neuropathic pain. Denies CP, SOB, N/V/D. No insomnia, anxiety,   Objective: Vital Signs: Blood pressure 139/82, pulse 89, temperature 98.3 F (36.8 C), temperature source Oral, resp. rate 18, height 6\' 1"  (1.854 m), weight 85.3 kg (188 lb), SpO2 100 %. No results found. No results for input(s): WBC, HGB, HCT, PLT in the last 72 hours. No results for input(s): NA, K, CL, GLUCOSE, BUN, CREATININE, CALCIUM in the last 72 hours.  Invalid input(s): CO CBG (last 3)   Recent Labs  06/12/16 2129 06/13/16 0636 06/13/16 1143  GLUCAP 148* 89 109*    Wt Readings from Last 3 Encounters:  06/06/16 85.3 kg (188 lb)  06/03/16 85.3 kg (188 lb)  05/26/16 85.6 kg (188 lb 12.8 oz)    Physical Exam:  BP 139/82 (BP Location: Right Arm)   Pulse 89   Temp 98.3 F (36.8 C) (Oral)   Resp 18   Ht 6\' 1"  (1.854 m)   Wt 85.3 kg (188 lb)   SpO2 100%   BMI 24.80 kg/m  Constitutional: He appears well-developed and well-nourished.  HENT: Normocephalic and atraumatic.  Eyes: Conjunctivae and EOM are normal.   Cardiovascular: Normal rate and regular rhythm.   Respiratory: Effort normal and breath sounds normal.  GI: Soft. Bowel sounds are normal.  Neurological: He is alert and oriented.  Speech clear.  Motor: 3+ to 4/5 throughout (pain inhibition) both lower ext. No sensory deficits Uro: cranberry colored urine Skin: Skin is warm and dry. Back wound c/d/i.  Psychiatric: He is appropriate    Assessment/Plan: 1. Functional deficits secondary to RLE radiculopathy  Stable for D/C today F/u PCP in 3-4 weeks F/u PM&R 2 weeks See D/C summary See D/C instructions Function:  Bathing Bathing position   Position: Shower  Bathing parts Body parts bathed by patient: Right arm, Left arm, Left  lower leg, Chest, Abdomen, Front perineal area, Buttocks, Right upper leg, Right lower leg, Left upper leg, Back    Bathing assist Assist Level: More than reasonable time   Set up : To obtain items  Upper Body Dressing/Undressing Upper body dressing   What is the patient wearing?: Pull over shirt/dress, Orthosis     Pull over shirt/dress - Perfomed by patient: Thread/unthread right sleeve, Put head through opening, Pull shirt over trunk, Thread/unthread left sleeve       Orthosis activity level: Performed by patient  Upper body assist Assist Level: More than reasonable time   Set up : To obtain clothing/put away  Lower Body Dressing/Undressing Lower body dressing   What is the patient wearing?: Underwear, Pants, [employer] Family [employer]ed Hose, Shoes Underwear - Performed by patient: Thread/unthread left underwear leg, Pull underwear up/down, Thread/unthread right underwear leg   Pants- Performed by patient: Thread/unthread right pants leg, Thread/unthread left pants leg, Pull pants up/down     Non-skid slipper socks- Performed by helper: Don/doff right sock, Don/doff left sock     Shoes - Performed by patient: Don/doff right shoe, Don/doff left shoe (Slip-ons)       TED Hose - Performed by patient: Don/doff right TED hose, Don/doff left TED hose TED Hose - Performed by helper: Don/doff right TED hose, Don/doff left TED hose  Lower body assist Assist for lower body dressing: More than reasonable time      Toileting Toileting  Toileting activity did not occur: Refused Toileting steps completed by patient: Adjust clothing prior to toileting, Performs perineal hygiene, Adjust clothing after toileting   Toileting Assistive Devices: Grab bar or rail  Toileting assist Assist level: More than reasonable time   Transfers Chair/bed transfer   Chair/bed transfer method: Ambulatory Chair/bed transfer assist level: No Help, no cues, assistive device, takes more than a reasonable amount of time Chair/bed  transfer assistive device: Hospital doctorCane     Locomotion Ambulation     Max distance: 300 Assist level: No help, No cues, assistive device, takes more than a reasonable amount of time   Wheelchair   Type: Manual Max wheelchair distance: 200 ft Assist Level: Supervision or verbal cues  Cognition Comprehension Comprehension assist level: Follows complex conversation/direction with no assist  Expression Expression assist level: Expresses complex ideas: With no assist  Social Interaction Social Interaction assist level: Interacts appropriately with others - No medications needed.  Problem Solving Problem solving assist level: Solves complex problems: Recognizes & self-corrects  Memory Memory assist level: Complete Independence: No helper     Medical Problem List and Plan: 1.  Gait abnormality secondary to RLE radiculopathy.  -  -pt to discharge to ALF today 2.  DVT Prophylaxis/Anticoagulation: Pharmaceutical: Lovenox (hold) 3. Pain Management:  Managed on oxycodone prn.   -schedule oxycodone 10mg  daily at 0700 and 1200  Gabapentin increased to 400 TID on 8/16 , follow-up physical medicine and rehabilitation as an outpatient  -prn robaxin 4. Mood: LCSW to follow for evaluation and support.  5. Neuropsych: This patient is capable of making decisions on his own behalf. 6. Skin/Wound Care: Monitor wound daily for healing.  7. Fluids/Electrolytes/Nutrition: Monitor I/O.   8. T2DM: Monitor BS ac/hs. Continue glucotrol Xl daily and metformin bid.  9. HTN: Monitor BP bid. Continue cozaar, HCTZ.  10. Constipation: On amitiza bid.      wean 11. Leukocytosis  WBCs 10.5 most recently  Will cont to monitor 12. ABLA  Hb 12.4   13. Hematuria:  -PSA 12  -cyst/mass 3x3x3cm lower right kidney/large prostate  -lovenox stopped  -labs stable  -appreciate urology consult. CT/cysto as outpatient   LOS (Days) 8 A FACE TO FACE EVALUATION WAS PERFORMED  Claudette LawsKIRSTEINS,ANDREW E 06/13/2016 11:54 AM
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
## 2016-06-26 ENCOUNTER — Encounter: Payer: 59 | Admitting: Physical Medicine & Rehabilitation

## 2016-07-02 ENCOUNTER — Encounter: Payer: No Typology Code available for payment source | Admitting: Physical Medicine & Rehabilitation

## 2016-07-02 NOTE — Progress Notes (Signed)
Received call from [employer] [place]. Pt scheduled for appt with Dr Hermelinda MedicusSchwartz but did not have contact info to follow up. Provided MD phone number. Isidoro DonningAlesia Naysha Sholl RN CCM Case Mgmt phone [phone]

## 2016-07-09 ENCOUNTER — Encounter: Payer: Self-pay | Admitting: Physical Medicine & Rehabilitation

## 2016-07-09 ENCOUNTER — Encounter
Payer: Worker's Compensation | Attending: Physical Medicine & Rehabilitation | Admitting: Physical Medicine & Rehabilitation

## 2016-07-09 VITALS — BP 150/92 | HR 78 | Resp 16

## 2016-07-09 DIAGNOSIS — Z981 Arthrodesis status: Secondary | ICD-10-CM

## 2016-07-09 DIAGNOSIS — R531 Weakness: Secondary | ICD-10-CM | POA: Insufficient documentation

## 2016-07-09 DIAGNOSIS — Z79899 Other long term (current) drug therapy: Secondary | ICD-10-CM | POA: Diagnosis not present

## 2016-07-09 DIAGNOSIS — M5410 Radiculopathy, site unspecified: Secondary | ICD-10-CM

## 2016-07-09 DIAGNOSIS — M549 Dorsalgia, unspecified: Secondary | ICD-10-CM

## 2016-07-09 DIAGNOSIS — E119 Type 2 diabetes mellitus without complications: Secondary | ICD-10-CM | POA: Diagnosis not present

## 2016-07-09 DIAGNOSIS — K5900 Constipation, unspecified: Secondary | ICD-10-CM | POA: Insufficient documentation

## 2016-07-09 DIAGNOSIS — G8929 Other chronic pain: Secondary | ICD-10-CM

## 2016-07-09 DIAGNOSIS — R319 Hematuria, unspecified: Secondary | ICD-10-CM | POA: Diagnosis not present

## 2016-07-09 DIAGNOSIS — I10 Essential (primary) hypertension: Secondary | ICD-10-CM | POA: Insufficient documentation

## 2016-07-09 DIAGNOSIS — K5903 Drug induced constipation: Secondary | ICD-10-CM

## 2016-07-09 DIAGNOSIS — M4316 Spondylolisthesis, lumbar region: Secondary | ICD-10-CM | POA: Diagnosis not present

## 2016-07-09 DIAGNOSIS — T402X5A Adverse effect of other opioids, initial encounter: Secondary | ICD-10-CM

## 2016-07-09 DIAGNOSIS — R200 Anesthesia of skin: Secondary | ICD-10-CM | POA: Insufficient documentation

## 2016-07-09 DIAGNOSIS — W19XXXA Unspecified fall, initial encounter: Secondary | ICD-10-CM

## 2016-07-09 DIAGNOSIS — R269 Unspecified abnormalities of gait and mobility: Secondary | ICD-10-CM | POA: Diagnosis present

## 2016-07-09 MED ORDER — GABAPENTIN 300 MG PO CAPS: 600.0000 mg | ORAL_CAPSULE | Freq: Three times a day (TID) | ORAL | 0 refills | Status: AC

## 2016-07-09 MED ORDER — LUBIPROSTONE 24 MCG PO CAPS: 24.0000 ug | ORAL_CAPSULE | Freq: Every day | ORAL | 0 refills | Status: AC

## 2016-07-09 NOTE — Progress Notes (Addendum)
Subjective:    Patient ID: Jacob Bond, male    DOB: 04/23/1956, 60 y.o.   MRN: [id number]  HPI  60 y.o. male with history of DM, HTN, back injury with RLE neuropathy with work up revealing degenerative spondylolisthesis L4/5 with RLE weakness presents for hospital follow up.  Pt workman's comp.  He is currently at an ALF.  At discharge, he was instructed to follow up with Dr. Shon BatonBrooks, who stated not therapies at this time.  He has an appointment to see Urology.  He continues to wear his LSO.  He continues to have neuropathic pain.  He fell out of the bed last night when he had a dream.  He just received his rolling walker 4 days ago.  His CBGs are ~140.  His BMs are okay.    DME: Shower chair Therapies: On hold per Ortho Mobility: Rolling walker  Pain Inventory Average Pain 8 Pain Right Now 8 My pain is sharp and aching  In the last 24 hours, has pain interfered with the following? General activity 7 Relation with others 7 Enjoyment of life 7 What TIME of day is your pain at its worst? morning, night Sleep (in general) Fair  Pain is worse with: standing Pain improves with: rest and medication Relief from Meds: 6  Mobility walk with assistance use a walker how many minutes can you walk? ? ability to climb steps?  no do you drive?  no use a wheelchair  Function not employed: date last employed Workers comp claim  Neuro/Psych numbness tingling trouble walking spasms  Prior Studies hospital f/u  Physicians involved in your care hospital f/u   History reviewed. No pertinent family history. Social History   Social History  . Marital status: Legally Separated    Spouse name: N/A  . Number of children: N/A  . Years of education: N/A   Social History Main Topics  . Smoking status: Never Smoker  . Smokeless tobacco: Never Used  . Alcohol use Yes     Comment: rarely  . Drug use: No  . Sexual activity: Not Asked   Other Topics Concern  . None   Social  History Narrative  . None   Past Surgical History:  Procedure Laterality Date  . KNEE ARTHROSCOPY Right   . TRANSFORAMINAL LUMBAR INTERBODY FUSION (TLIF) WITH PEDICLE SCREW FIXATION 1 LEVEL N/A 06/03/2016   Procedure: TRANSFORAMINAL LUMBAR INTERBODY FUSION (TLIF) WITH PEDICLE SCREW FIXATION 1 LEVEL;  Surgeon: Venita Lickahari Brooks, MD;  Location: [facility] OR;  Service: Orthopedics;  Laterality: N/A;   Past Medical History:  Diagnosis Date  . Asthma    child  . Back pain   . Diabetes mellitus without complication (HCC)   . Hypertension    BP (!) 150/92 (BP Location: Left Arm, Patient Position: Sitting, Cuff Size: Large)   Pulse 78   Resp 16   SpO2 97%   Opioid Risk Score:   Fall Risk Score:  `1  Depression screen PHQ 2/9  No flowsheet data found.  Review of Systems  Constitutional: Positive for appetite change.  Gastrointestinal: Positive for abdominal pain.  Endocrine:       High blood sugar  Musculoskeletal: Positive for back pain and gait problem.       Spasms   Neurological:       Tingling  All other systems reviewed and are negative.      Objective:   Physical Exam Constitutional: He appears well-developed and well-nourished. NAD.  HENT: Normocephalic and atraumatic.  Eyes: Conjunctivae and EOM are normal.   Cardiovascular: Normal rate and regular rhythm.   Respiratory: Effort normal and breath sounds normal.  GI: Soft. Bowel sounds are normal.  Neurological: He is alert and oriented.  Speech clear.  Motor:  B/l UE: 5/5 proximal to distal B/l LE: 4+/5 hip flexion proximal to distal  Diminished sensation in b/l feet Skin: Skin is warm and dry. Back wound with steristrips c/d/i.  Psychiatric: He is appropriate      Assessment & Plan:  60 y.o. male with history of DM, HTN, back injury with RLE neuropathy with work up revealing degenerative spondylolisthesis L4/5 with RLE weakness presents for hospital follow up.  1.  Gait abnormality secondary to RLE  radiculopathy.  S/p lumbar fusion  Therapies on hold per Ortho  Resume therapies per Ortho  2. Radicular pain  Cont meds per ALF  Consider increase Gabapentin 600 TID  3. Hematuria  Follow up with Urology  Elevated PSA  3x3 Mass on imaging  CT/cysto as outpatient per Urology  4.  T2DM  Pt has not seen PCP, follow up with regarding diabetic management  CBGs appears to be relatively controlled  Cont meds  5. HTN  Cont meds  Follow up PCP  6. Constipation:   Wean amitiza to daily for 2 weeks, then d/c  7. Neurologic gait  Cont rolling walker  8. Fall out of bed  With increased pain  Xrays ordered

## 2016-08-05 ENCOUNTER — Encounter: Payer: 59 | Attending: Physical Medicine & Rehabilitation | Admitting: Physical Medicine & Rehabilitation

## 2017-03-08 IMAGING — US US RENAL
1 series · 14 of 25 positions shown · non-contrast
Comparison: CT of the lumbar spine May 23, 2016

CLINICAL DATA: Hematuria

EXAM:
RENAL / URINARY TRACT ULTRASOUND COMPLETE

[Series 1: us renal · 0.23mm/px · 14 of 45 slices shown]
[im 1/45]
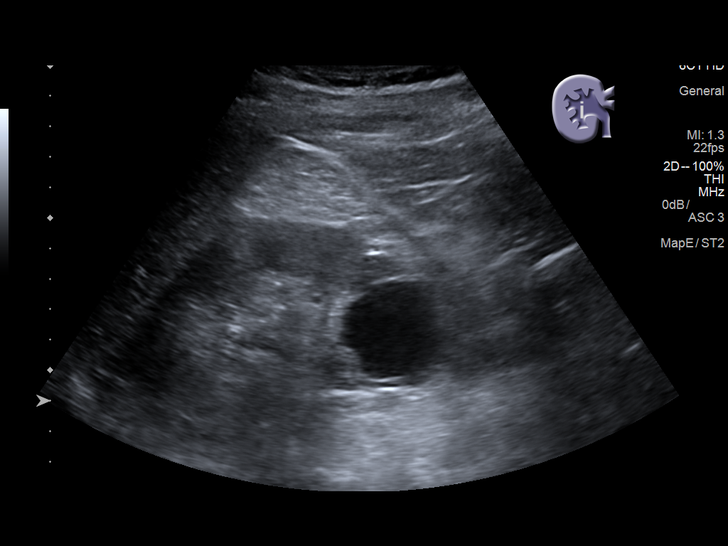
[im 4/45]
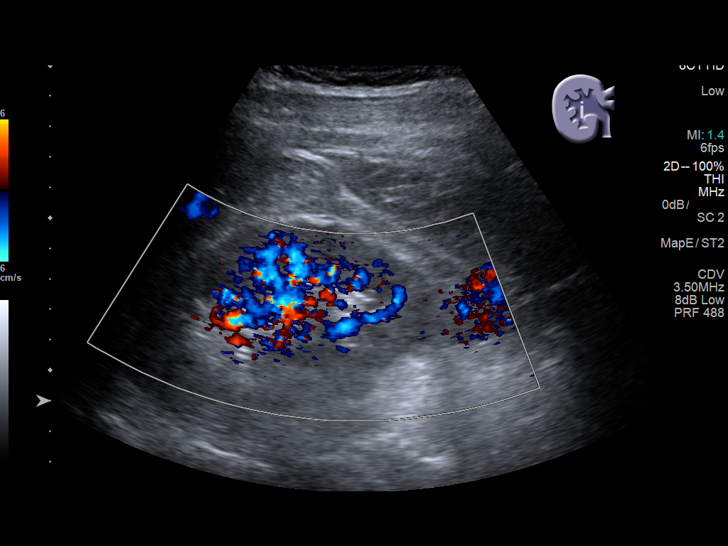
[im 8/45]
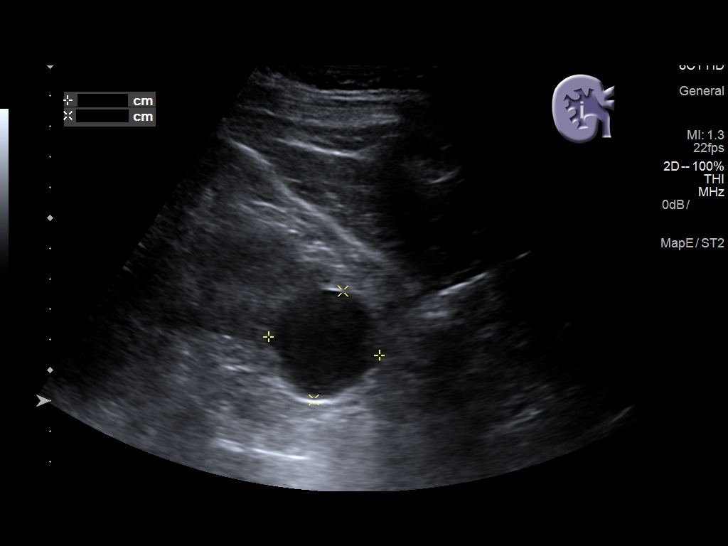
[im 12/45]
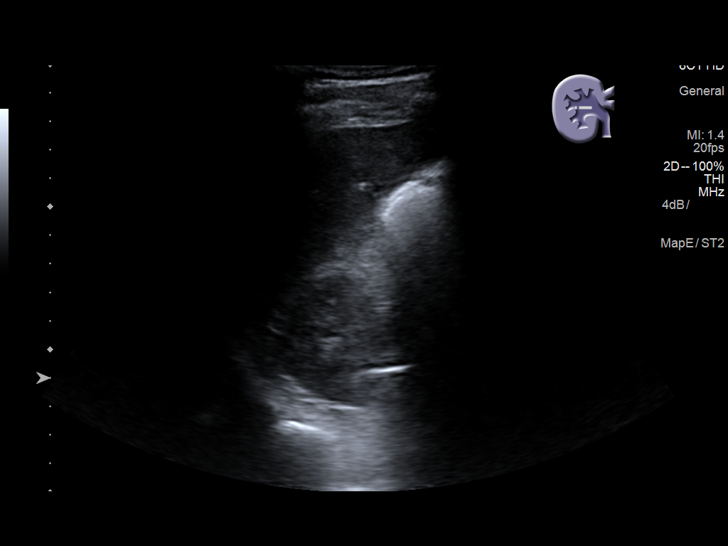
[im 15/45]
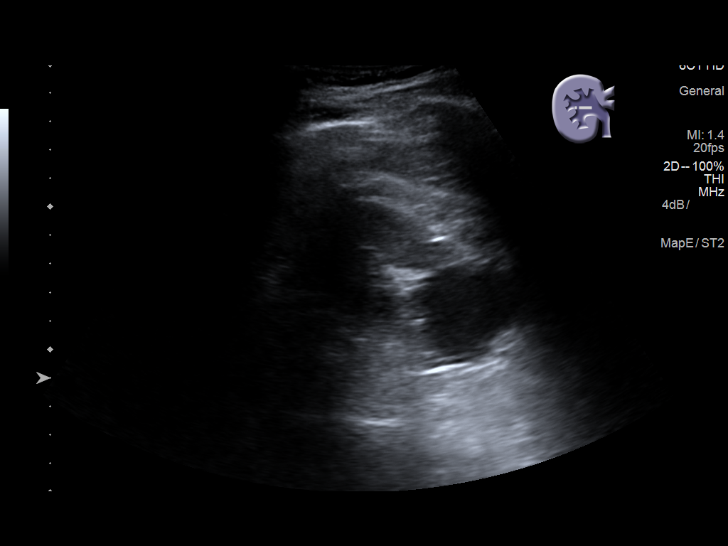
[im 17/45]
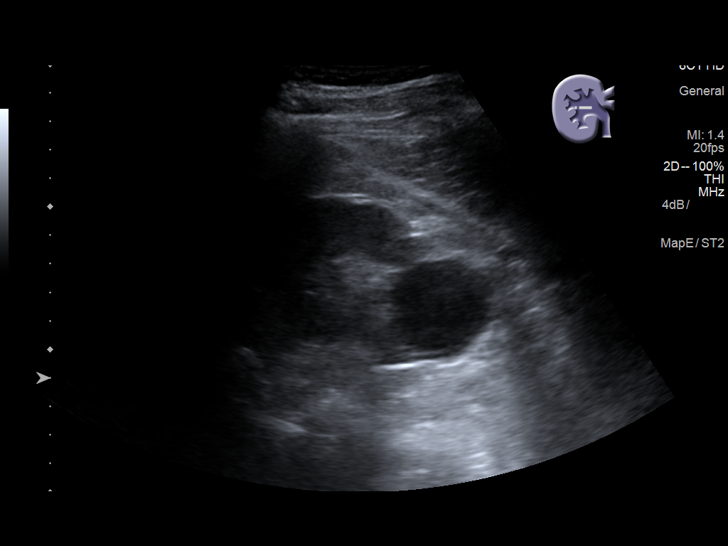
[im 21/45]
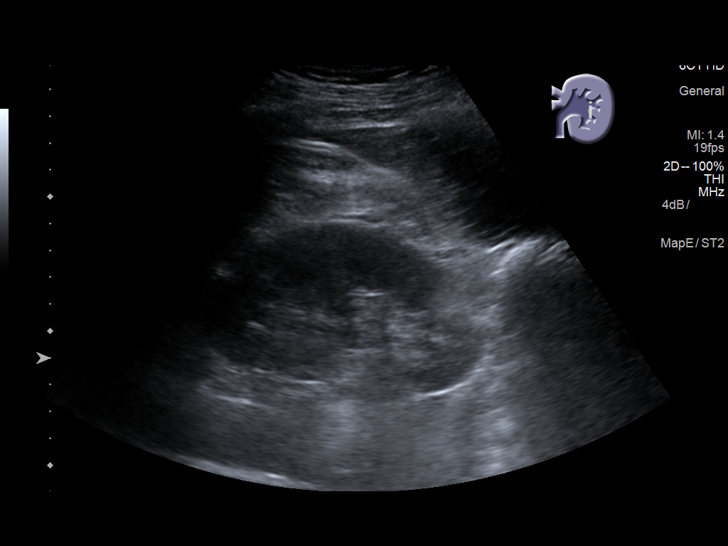
[im 24/45]
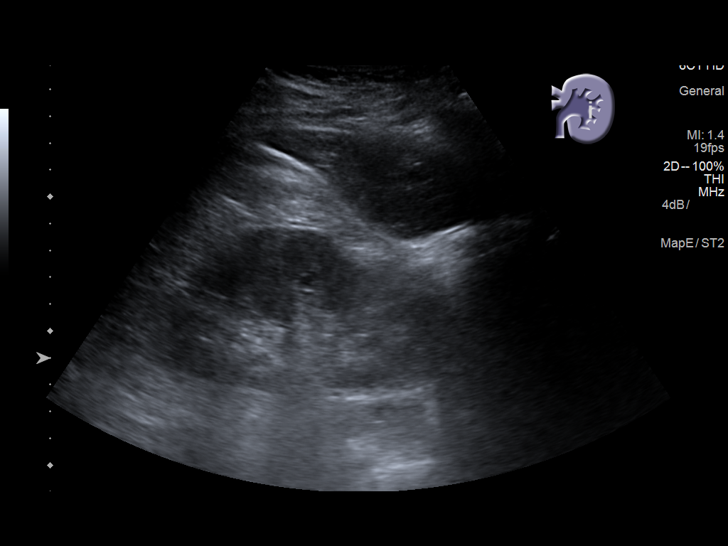
[im 28/45]
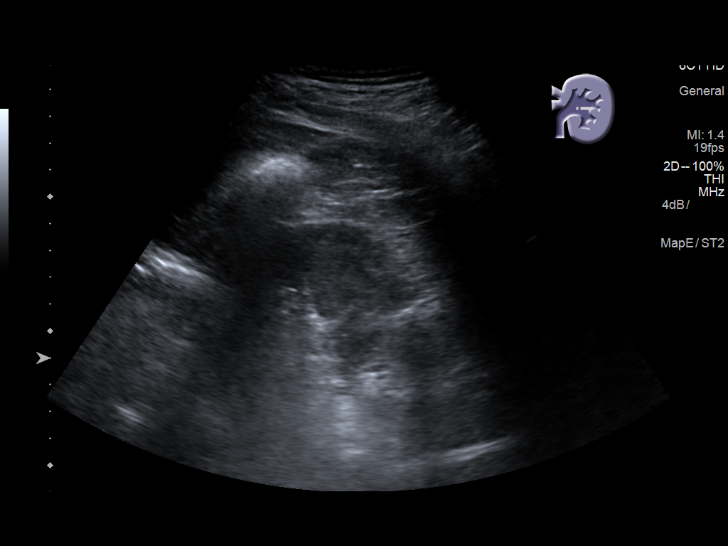
[im 30/45]
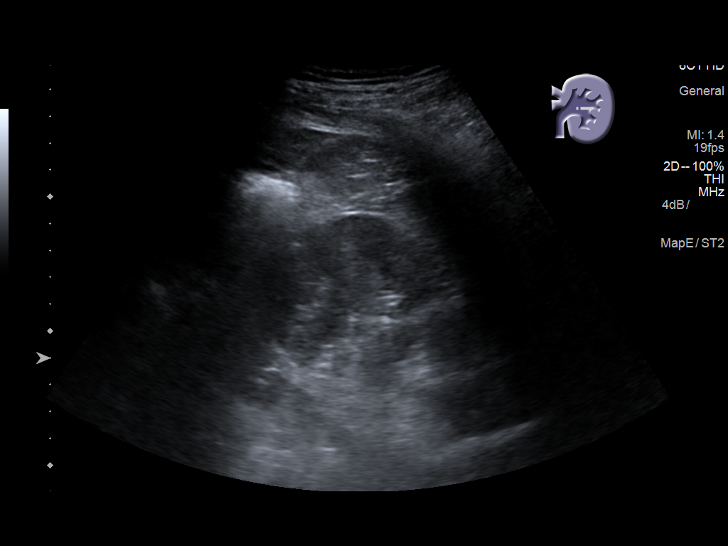
[im 34/45]
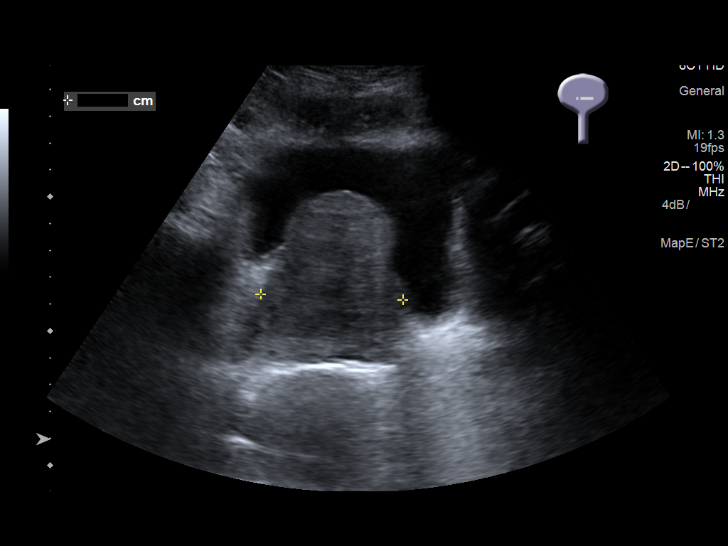
[im 37/45]
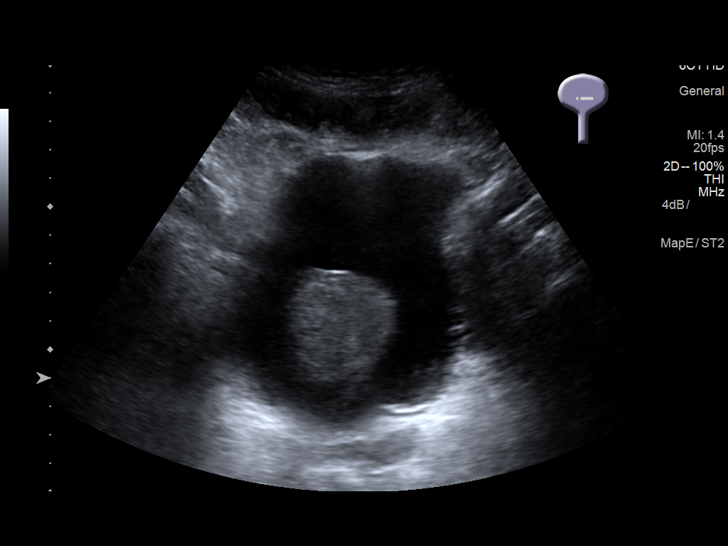
[im 41/45]
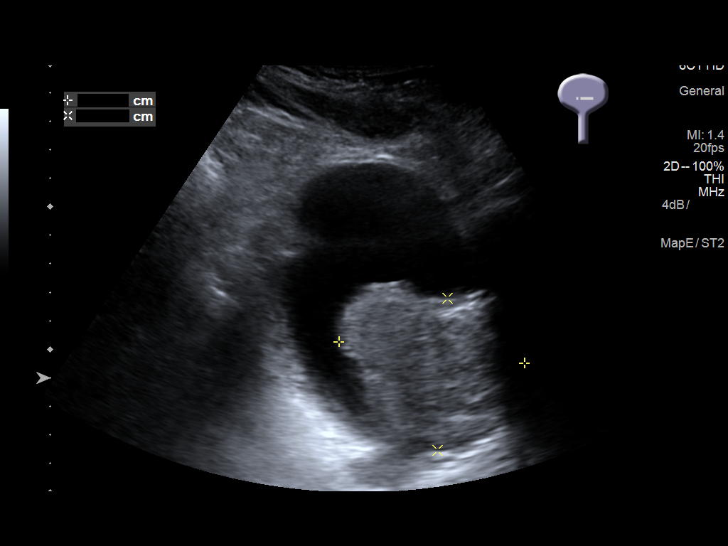
[im 45/45]
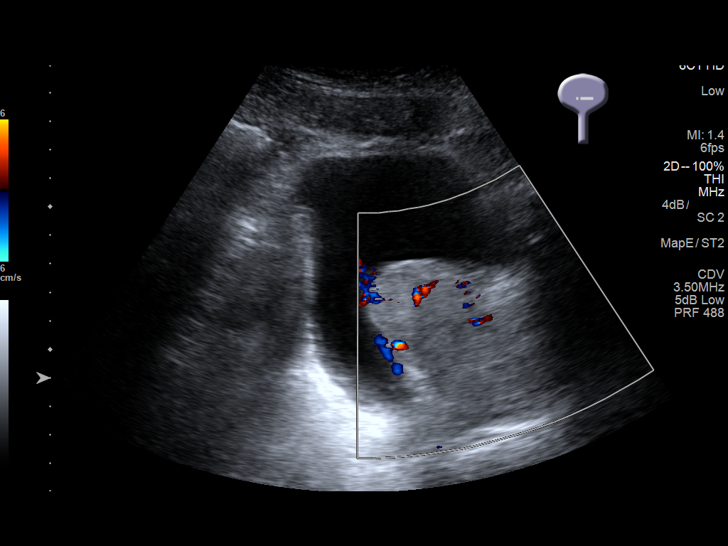

[14 of 25 positions shown; findings below may reference images not displayed]

FINDINGS: Right Kidney:

Length: 11.1 cm. There is a 3.7 x 3.7 x 3.4 cm mass in the lower
right kidney which is anechoic with increased through transmission.
The attenuation in this mass on a recent CT lumbar spine is less
than 10 Hounsfield units with multiple measurements. The right
kidney is otherwise normal.

Left Kidney:

Length: 11.5 cm. Echogenicity within normal limits. No mass or
hydronephrosis visualized.

Bladder:

Appears normal for degree of bladder distention.

The prostate is enlarged measuring 6.6 x 5.3 x 5.3 cm
IMPRESSION: 1. The mass in the lower right kidney is consistent with a cyst
based on ultrasound and CT imaging.
2. Prostate enlargement.
3. A definitive cause for hematuria is not seen. CT and MRI are both
much more sensitive and specific for causes of hematuria and could
be performed if concern persists.

## 2018-03-18 IMAGING — RF DG LUMBAR SPINE 2-3V
1 series · 2 of 2 positions shown · non-contrast
Comparison: CT scan dated 05/23/2016

CLINICAL DATA: Lumbar disc herniation at L4-5.  Spondylolisthesis.

EXAM:
DG C-ARM 61-120 MIN; LUMBAR SPINE - 2-3 VIEW

[Series 1: run · 2 of 2 slices shown]
[im 1/2]
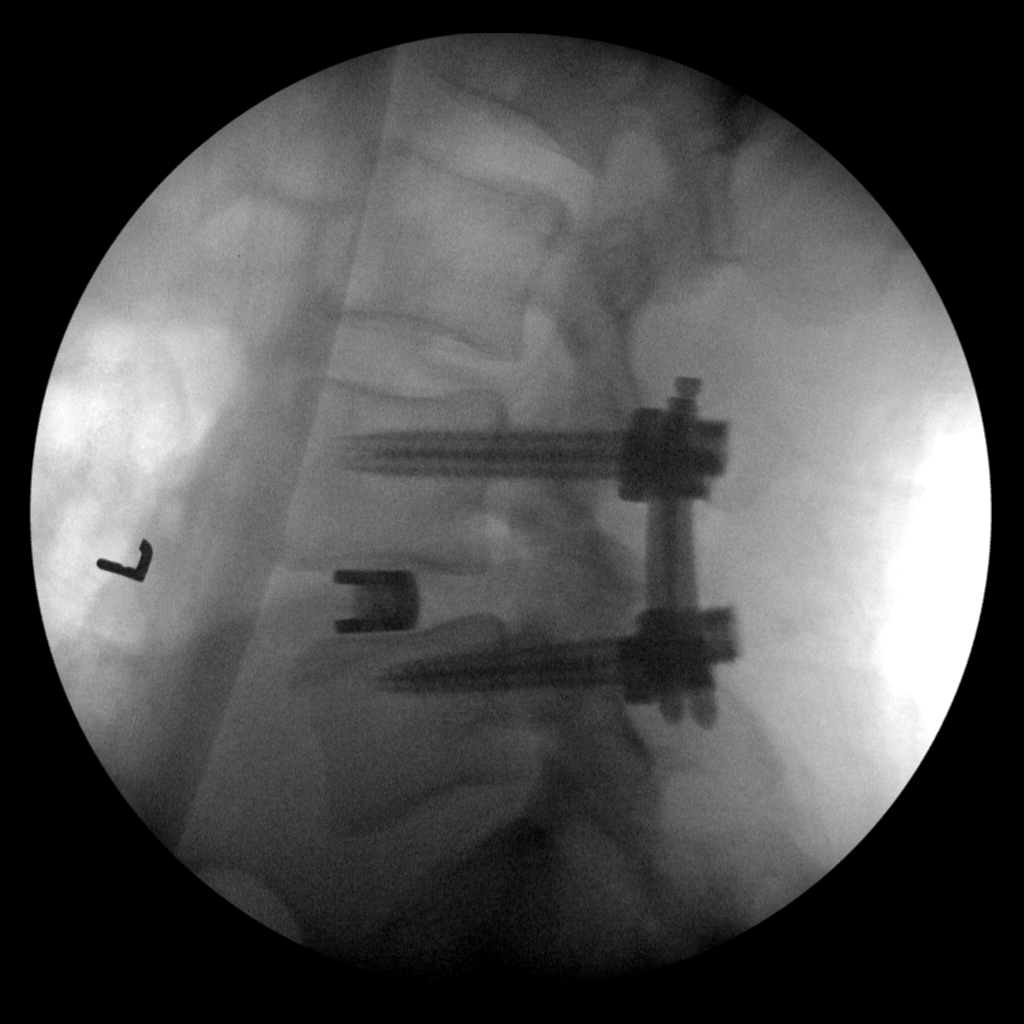
[im 2/2]
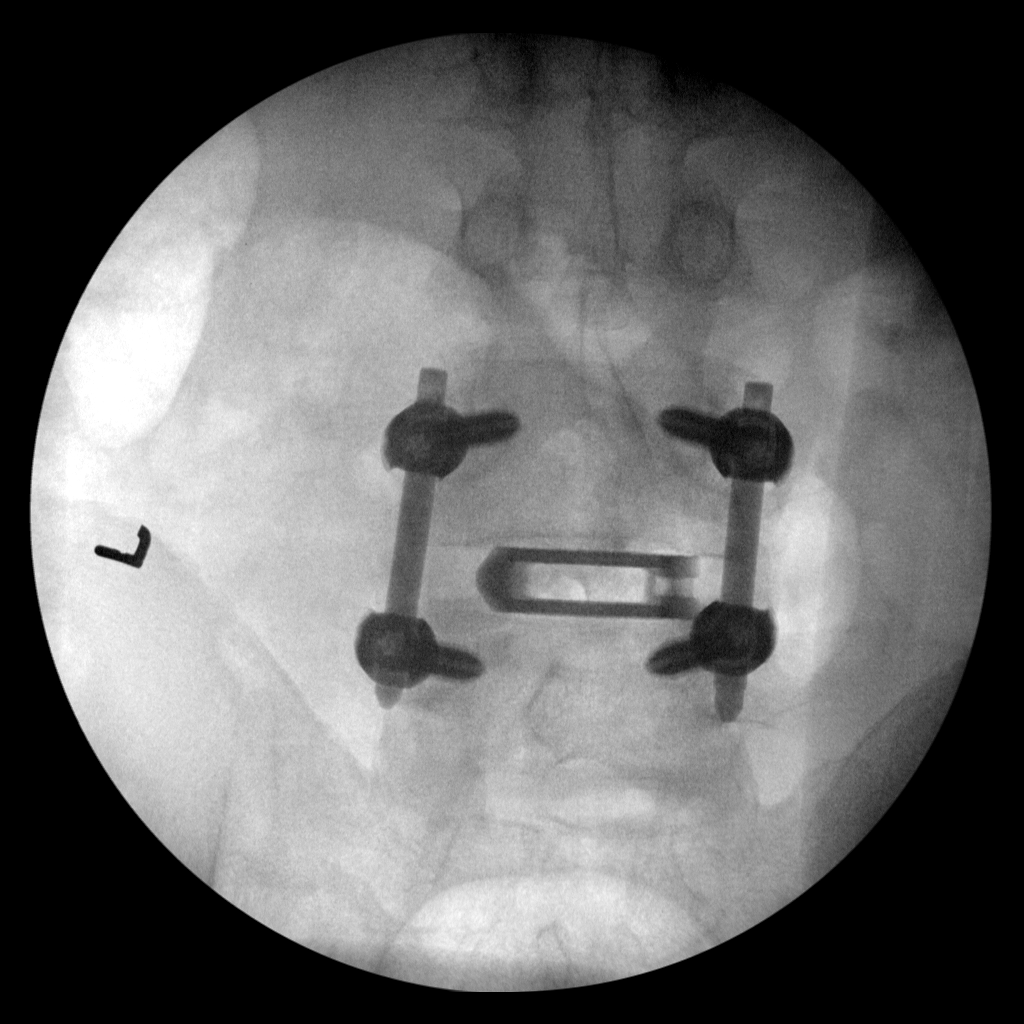

[2 of 2 positions shown; findings below may reference images not displayed]

FINDINGS: AP and lateral C-arm images demonstrate the patient has undergone
interbody and posterior fusion at L4-5. Spondylolisthesis is almost
completely eliminated. Hardware appears in excellent position.
IMPRESSION: Interbody and posterior fusion performed at L4-5. C-arm imaging
provided intraoperatively.

## 2018-03-19 IMAGING — DX DG LUMBAR SPINE 2-3V
2 series · 2 of 2 positions shown · non-contrast
Comparison: Yesterday

CLINICAL DATA: Postoperative pain.  Lumbar fusion

EXAM:
LUMBAR SPINE - 2-3 VIEW

[t lumbar spine ap]
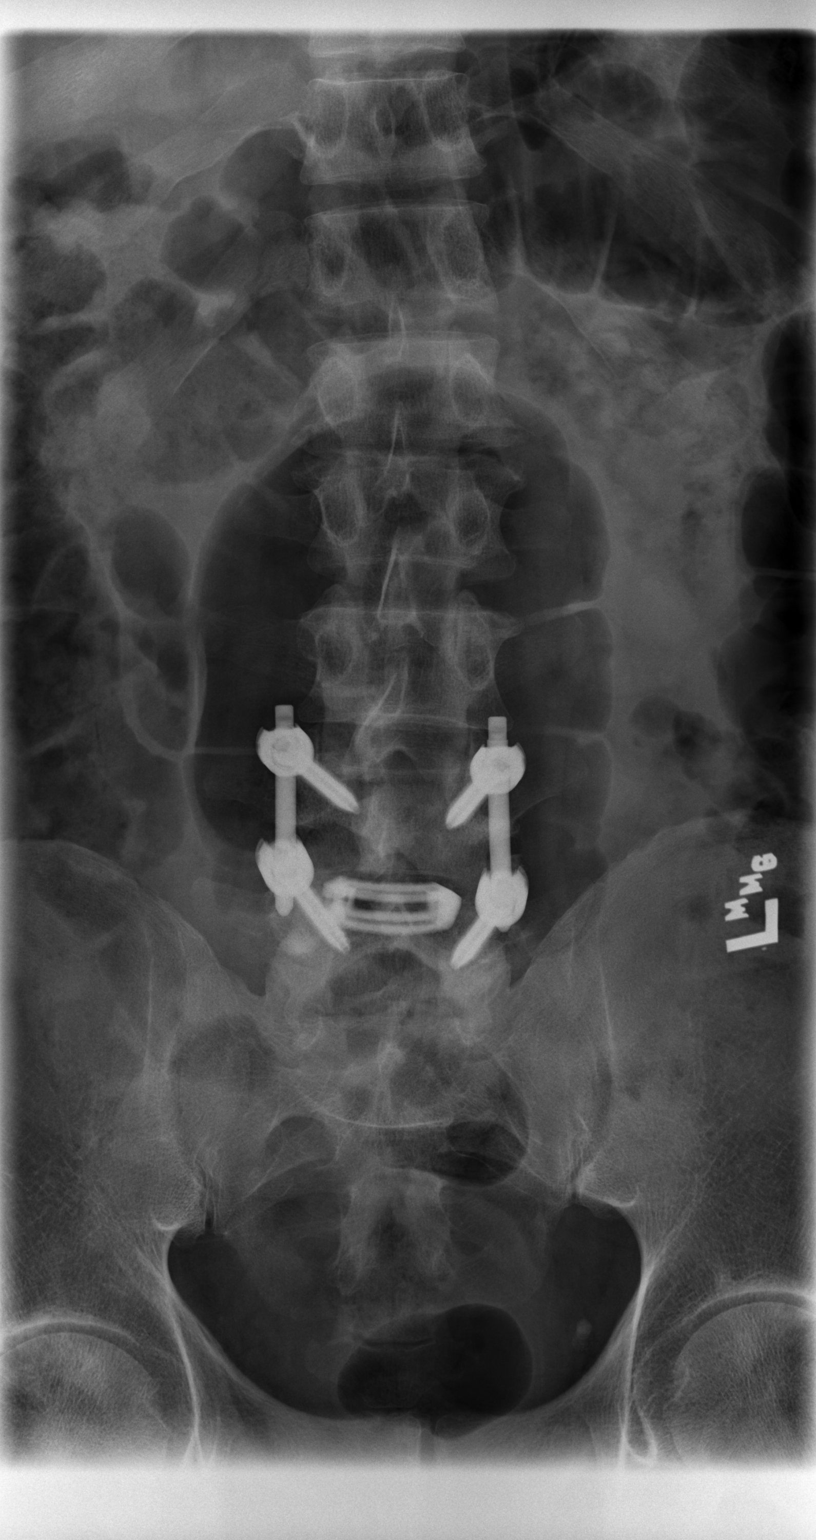

[t lumbar spine lat]
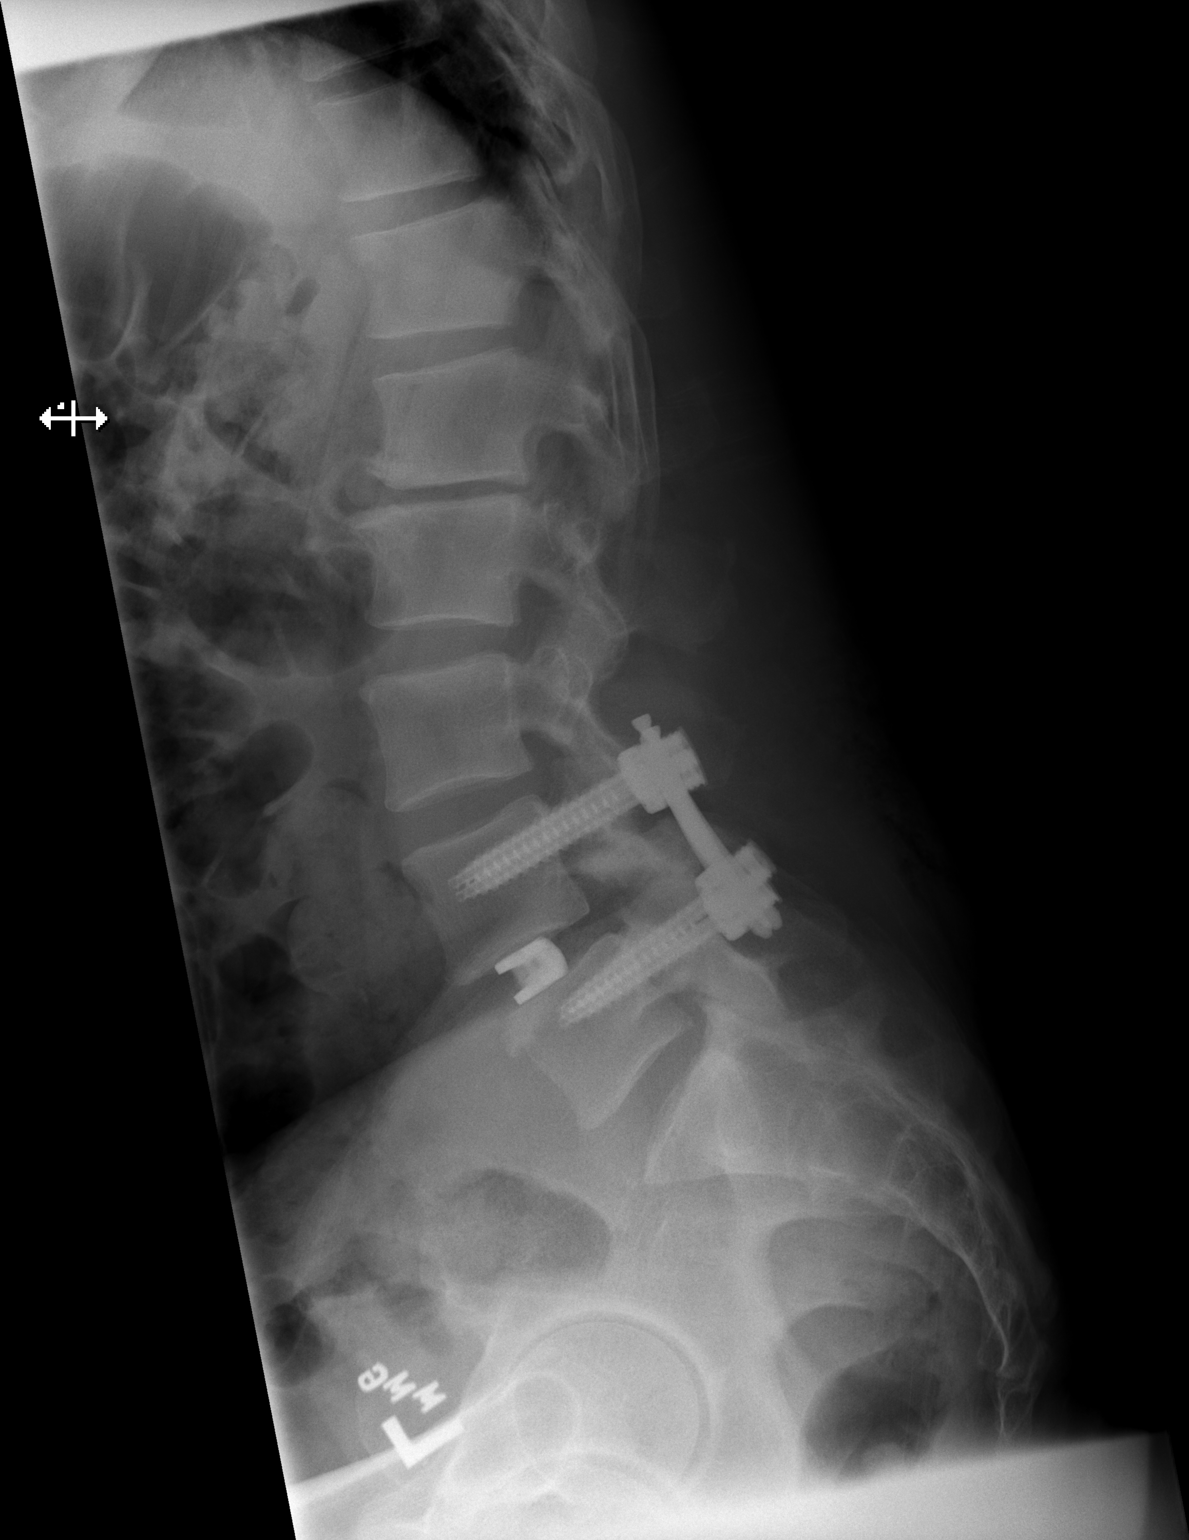

[2 of 2 positions shown; findings below may reference images not displayed]

FINDINGS: Recent L4-5 discectomy with stable unremarkable positioning of
intervertebral cage. Posterior fusion hardware is intact. Slight
residual anterolisthesis is stable. No evidence of osseous fracture.
Advanced L1-2 disc degeneration.
IMPRESSION: No acute finding. Stable hardware positioning compared to placement
fluoroscopy.

## 2021-11-26 DEATH — deceased
